# Patient Record
Sex: Male | Born: 1935 | Race: White | Hispanic: No | Marital: Married | Smoking: Former smoker
Health system: Southern US, Community
[De-identification: ages and names within clinical notes are randomized; demographics above are authoritative.]

## PROBLEM LIST (undated history)

## (undated) DIAGNOSIS — J45909 Unspecified asthma, uncomplicated: Secondary | ICD-10-CM

## (undated) DIAGNOSIS — I00 Rheumatic fever without heart involvement: Secondary | ICD-10-CM

## (undated) DIAGNOSIS — I471 Supraventricular tachycardia, unspecified: Secondary | ICD-10-CM

## (undated) DIAGNOSIS — B351 Tinea unguium: Secondary | ICD-10-CM

## (undated) DIAGNOSIS — J449 Chronic obstructive pulmonary disease, unspecified: Secondary | ICD-10-CM

## (undated) DIAGNOSIS — I2510 Atherosclerotic heart disease of native coronary artery without angina pectoris: Secondary | ICD-10-CM

## (undated) DIAGNOSIS — L570 Actinic keratosis: Secondary | ICD-10-CM

## (undated) DIAGNOSIS — K5190 Ulcerative colitis, unspecified, without complications: Secondary | ICD-10-CM

## (undated) DIAGNOSIS — I10 Essential (primary) hypertension: Secondary | ICD-10-CM

## (undated) DIAGNOSIS — R739 Hyperglycemia, unspecified: Secondary | ICD-10-CM

## (undated) DIAGNOSIS — I420 Dilated cardiomyopathy: Secondary | ICD-10-CM

## (undated) DIAGNOSIS — L219 Seborrheic dermatitis, unspecified: Secondary | ICD-10-CM

## (undated) DIAGNOSIS — I872 Venous insufficiency (chronic) (peripheral): Secondary | ICD-10-CM

## (undated) DIAGNOSIS — B353 Tinea pedis: Secondary | ICD-10-CM

## (undated) DIAGNOSIS — E785 Hyperlipidemia, unspecified: Secondary | ICD-10-CM

## (undated) DIAGNOSIS — M1990 Unspecified osteoarthritis, unspecified site: Secondary | ICD-10-CM

## (undated) HISTORY — PX: ILEOSTOMY: SHX1783

## (undated) HISTORY — PX: FUNCTIONAL ENDOSCOPIC SINUS SURGERY: SUR616

## (undated) HISTORY — PX: TONSILLECTOMY: SUR1361

## (undated) HISTORY — DX: Unspecified asthma, uncomplicated: J45.909

## (undated) HISTORY — DX: Tinea pedis: B35.3

## (undated) HISTORY — DX: Actinic keratosis: L57.0

## (undated) HISTORY — DX: Chronic obstructive pulmonary disease, unspecified: J44.9

## (undated) HISTORY — DX: Ulcerative colitis, unspecified, without complications: K51.90

## (undated) HISTORY — DX: Hyperglycemia, unspecified: R73.9

## (undated) HISTORY — DX: Supraventricular tachycardia, unspecified: I47.10

## (undated) HISTORY — DX: Tinea unguium: B35.1

## (undated) HISTORY — DX: Atherosclerotic heart disease of native coronary artery without angina pectoris: I25.10

## (undated) HISTORY — DX: Unspecified osteoarthritis, unspecified site: M19.90

## (undated) HISTORY — PX: CATARACT EXTRACTION: SUR2

## (undated) HISTORY — PX: PROSTATE SURGERY: SHX751

## (undated) HISTORY — DX: Venous insufficiency (chronic) (peripheral): I87.2

## (undated) HISTORY — PX: OTHER SURGICAL HISTORY: SHX169

## (undated) HISTORY — PX: APPENDECTOMY: SHX54

## (undated) HISTORY — DX: Essential (primary) hypertension: I10

## (undated) HISTORY — DX: Dilated cardiomyopathy: I42.0

## (undated) HISTORY — DX: Hyperlipidemia, unspecified: E78.5

## (undated) HISTORY — DX: Supraventricular tachycardia: I47.1

## (undated) HISTORY — DX: Seborrheic dermatitis, unspecified: L21.9

## (undated) HISTORY — PX: BLADDER STONE REMOVAL: SHX568

---

## 1978-08-17 DIAGNOSIS — Z9289 Personal history of other medical treatment: Secondary | ICD-10-CM

## 1978-08-17 HISTORY — DX: Personal history of other medical treatment: Z92.89

## 2016-03-09 HISTORY — PX: OTHER SURGICAL HISTORY: SHX169

## 2018-10-11 DIAGNOSIS — C4492 Squamous cell carcinoma of skin, unspecified: Secondary | ICD-10-CM

## 2018-10-11 HISTORY — DX: Squamous cell carcinoma of skin, unspecified: C44.92

## 2018-11-04 HISTORY — PX: OTHER SURGICAL HISTORY: SHX169

## 2019-11-15 ENCOUNTER — Ambulatory Visit: Payer: Medicare HMO | Admitting: Dermatology

## 2019-11-15 ENCOUNTER — Other Ambulatory Visit: Payer: Self-pay

## 2019-11-15 DIAGNOSIS — L821 Other seborrheic keratosis: Secondary | ICD-10-CM

## 2019-11-15 DIAGNOSIS — L239 Allergic contact dermatitis, unspecified cause: Secondary | ICD-10-CM

## 2019-11-15 DIAGNOSIS — Z85828 Personal history of other malignant neoplasm of skin: Secondary | ICD-10-CM

## 2019-11-15 DIAGNOSIS — B353 Tinea pedis: Secondary | ICD-10-CM

## 2019-11-15 DIAGNOSIS — B351 Tinea unguium: Secondary | ICD-10-CM | POA: Diagnosis not present

## 2019-11-15 MED ORDER — FLUCONAZOLE 200 MG PO TABS: 200.0000 mg | ORAL_TABLET | ORAL | 1 refills | Status: DC

## 2019-11-15 NOTE — Progress Notes (Signed)
   Follow-Up Visit   Subjective  Jared Castro is a 86 y.o. male who presents for the following: Follow-up (Severe tinea pedis and unguium follow up - Diflucan 150mg  1 po 3 times per week.) and Other (Contact dermatitis secondary to ketoconazole follow up. Seems resolved.).  The patient is tolerating his medication well.  He has no history of liver problems and is not on any lipid medications.  He is concerned about some spots on his scalp he would like checked.  They are a bit irritating.  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  The the graft is discolored and he wonders about whether there is anything that can be done to blend this in better.  The following portions of the chart were reviewed this encounter and updated as appropriate:     Review of Systems: No other skin or systemic complaints.  Objective  Well appearing patient in no apparent distress; mood and affect are within normal limits.  A focused examination was performed including feet, scalp. Relevant physical exam findings are noted in the Assessment and Plan.  Objective  Scalp: Well healed excision site.  Objective  Scalp: Stuck-on, waxy, tan-brown papule or plaque --Discussed benign etiology and prognosis.   Objective  bilateral feet: Decreased scale. He is not on any cholesterol medication, does not have a history of liver issues and most recent labs showed normal LFTs.  Objective  Toenails: Severe toenail dystrophy.  Assessment & Plan  History of SCC (squamous cell carcinoma) of skin Scalp With with graft dyschromia.  Discussed using tinted sunscreen to blend the color in better while at the same time protecting against further sun damage and decreasing risk of recurrent or additional skin cancer of the scalp. Observe Recommend tinted sunscreen.    Seborrheic keratosis Scalp  May plan LN2 on follow up if symptomatic and  irritating.  Tinea pedis of right foot bilateral feet He is not on any cholesterol medication, does not have a history of liver issues and most recent labs showed normal LFTs. Reordered Medications fluconazole (DIFLUCAN) 200 MG tablet 1 pill 3 times a week x 2 mos  Tinea unguium Toenails  Reordered Medications fluconazole (DIFLUCAN) 200 MG tablet  History of allergic contact dermatitis to topical ketoconazole for tinea pedis.  The feet are clear today.   Return in about 4 months (around 03/16/2020) for Tinea.   I, Ashok Cordia, CMA, am acting as scribe for Sarina Ser, MD .

## 2019-12-15 ENCOUNTER — Other Ambulatory Visit: Payer: Self-pay | Admitting: Dermatology

## 2020-01-18 ENCOUNTER — Other Ambulatory Visit: Payer: Self-pay | Admitting: Dermatology

## 2020-01-18 DIAGNOSIS — B353 Tinea pedis: Secondary | ICD-10-CM

## 2020-01-18 DIAGNOSIS — B351 Tinea unguium: Secondary | ICD-10-CM

## 2020-03-09 ENCOUNTER — Other Ambulatory Visit: Payer: Self-pay | Admitting: Dermatology

## 2020-03-14 ENCOUNTER — Ambulatory Visit: Payer: Medicare HMO | Admitting: Dermatology

## 2020-04-04 ENCOUNTER — Other Ambulatory Visit: Payer: Self-pay | Admitting: Internal Medicine

## 2020-04-04 ENCOUNTER — Other Ambulatory Visit: Payer: Self-pay

## 2020-04-04 ENCOUNTER — Ambulatory Visit
Admission: RE | Admit: 2020-04-04 | Discharge: 2020-04-04 | Disposition: A | Discharge status: Home / Self Care | Payer: Medicare HMO | Source: Ambulatory Visit | Attending: Internal Medicine | Admitting: Internal Medicine

## 2020-04-04 DIAGNOSIS — R600 Localized edema: Secondary | ICD-10-CM | POA: Diagnosis present

## 2021-07-31 ENCOUNTER — Other Ambulatory Visit: Payer: Self-pay

## 2021-07-31 ENCOUNTER — Emergency Department: Payer: Medicare HMO

## 2021-07-31 ENCOUNTER — Inpatient Hospital Stay
Admission: EM | Admit: 2021-07-31 | Discharge: 2021-08-06 | DRG: 522 | Disposition: A | Discharge status: Home / Self Care | Payer: Medicare HMO | Attending: Family Medicine | Admitting: Family Medicine

## 2021-07-31 DIAGNOSIS — M25551 Pain in right hip: Secondary | ICD-10-CM

## 2021-07-31 DIAGNOSIS — M1909 Primary osteoarthritis, other specified site: Secondary | ICD-10-CM | POA: Diagnosis present

## 2021-07-31 DIAGNOSIS — Z20822 Contact with and (suspected) exposure to covid-19: Secondary | ICD-10-CM | POA: Diagnosis present

## 2021-07-31 DIAGNOSIS — S72011A Unspecified intracapsular fracture of right femur, initial encounter for closed fracture: Principal | ICD-10-CM | POA: Diagnosis present

## 2021-07-31 DIAGNOSIS — G8929 Other chronic pain: Secondary | ICD-10-CM

## 2021-07-31 DIAGNOSIS — I959 Hypotension, unspecified: Secondary | ICD-10-CM | POA: Diagnosis present

## 2021-07-31 DIAGNOSIS — Z95810 Presence of automatic (implantable) cardiac defibrillator: Secondary | ICD-10-CM

## 2021-07-31 DIAGNOSIS — Z79899 Other long term (current) drug therapy: Secondary | ICD-10-CM

## 2021-07-31 DIAGNOSIS — S7291XA Unspecified fracture of right femur, initial encounter for closed fracture: Secondary | ICD-10-CM | POA: Diagnosis present

## 2021-07-31 DIAGNOSIS — I472 Ventricular tachycardia, unspecified: Secondary | ICD-10-CM | POA: Diagnosis present

## 2021-07-31 DIAGNOSIS — E782 Mixed hyperlipidemia: Secondary | ICD-10-CM | POA: Diagnosis not present

## 2021-07-31 DIAGNOSIS — M545 Low back pain, unspecified: Secondary | ICD-10-CM

## 2021-07-31 DIAGNOSIS — D62 Acute posthemorrhagic anemia: Secondary | ICD-10-CM | POA: Diagnosis not present

## 2021-07-31 DIAGNOSIS — S72001A Fracture of unspecified part of neck of right femur, initial encounter for closed fracture: Secondary | ICD-10-CM | POA: Diagnosis not present

## 2021-07-31 DIAGNOSIS — Z8249 Family history of ischemic heart disease and other diseases of the circulatory system: Secondary | ICD-10-CM | POA: Diagnosis not present

## 2021-07-31 DIAGNOSIS — R410 Disorientation, unspecified: Secondary | ICD-10-CM | POA: Diagnosis not present

## 2021-07-31 DIAGNOSIS — I2510 Atherosclerotic heart disease of native coronary artery without angina pectoris: Secondary | ICD-10-CM | POA: Diagnosis present

## 2021-07-31 DIAGNOSIS — I483 Typical atrial flutter: Secondary | ICD-10-CM | POA: Diagnosis present

## 2021-07-31 DIAGNOSIS — E785 Hyperlipidemia, unspecified: Secondary | ICD-10-CM | POA: Diagnosis present

## 2021-07-31 DIAGNOSIS — I499 Cardiac arrhythmia, unspecified: Secondary | ICD-10-CM | POA: Insufficient documentation

## 2021-07-31 DIAGNOSIS — W010XXA Fall on same level from slipping, tripping and stumbling without subsequent striking against object, initial encounter: Secondary | ICD-10-CM | POA: Diagnosis present

## 2021-07-31 DIAGNOSIS — W19XXXA Unspecified fall, initial encounter: Secondary | ICD-10-CM

## 2021-07-31 DIAGNOSIS — Z932 Ileostomy status: Secondary | ICD-10-CM

## 2021-07-31 DIAGNOSIS — Z7982 Long term (current) use of aspirin: Secondary | ICD-10-CM

## 2021-07-31 DIAGNOSIS — I10 Essential (primary) hypertension: Secondary | ICD-10-CM

## 2021-07-31 DIAGNOSIS — Z419 Encounter for procedure for purposes other than remedying health state, unspecified: Secondary | ICD-10-CM

## 2021-07-31 DIAGNOSIS — Z87891 Personal history of nicotine dependence: Secondary | ICD-10-CM

## 2021-07-31 DIAGNOSIS — I4892 Unspecified atrial flutter: Secondary | ICD-10-CM

## 2021-07-31 DIAGNOSIS — G8918 Other acute postprocedural pain: Secondary | ICD-10-CM

## 2021-07-31 DIAGNOSIS — Z85828 Personal history of other malignant neoplasm of skin: Secondary | ICD-10-CM | POA: Diagnosis not present

## 2021-07-31 DIAGNOSIS — Z01811 Encounter for preprocedural respiratory examination: Secondary | ICD-10-CM

## 2021-07-31 DIAGNOSIS — R443 Hallucinations, unspecified: Secondary | ICD-10-CM

## 2021-07-31 DIAGNOSIS — I420 Dilated cardiomyopathy: Secondary | ICD-10-CM | POA: Diagnosis present

## 2021-07-31 LAB — CBC WITH DIFFERENTIAL/PLATELET
Abs Immature Granulocytes: 0.07 10*3/uL (ref 0.00–0.07)
Basophils Absolute: 0 10*3/uL (ref 0.0–0.1)
Basophils Relative: 0 %
Eosinophils Absolute: 0.1 10*3/uL (ref 0.0–0.5)
Eosinophils Relative: 1 %
HCT: 37.3 % — ABNORMAL LOW (ref 39.0–52.0)
Hemoglobin: 12.3 g/dL — ABNORMAL LOW (ref 13.0–17.0)
Immature Granulocytes: 1 %
Lymphocytes Relative: 7 %
Lymphs Abs: 0.8 10*3/uL (ref 0.7–4.0)
MCH: 30.4 pg (ref 26.0–34.0)
MCHC: 33 g/dL (ref 30.0–36.0)
MCV: 92.3 fL (ref 80.0–100.0)
Monocytes Absolute: 0.7 10*3/uL (ref 0.1–1.0)
Monocytes Relative: 6 %
Neutro Abs: 8.6 10*3/uL — ABNORMAL HIGH (ref 1.7–7.7)
Neutrophils Relative %: 85 %
Platelets: 205 10*3/uL (ref 150–400)
RBC: 4.04 MIL/uL — ABNORMAL LOW (ref 4.22–5.81)
RDW: 13.8 % (ref 11.5–15.5)
WBC: 10.2 10*3/uL (ref 4.0–10.5)
nRBC: 0 % (ref 0.0–0.2)

## 2021-07-31 LAB — BASIC METABOLIC PANEL
Anion gap: 10 (ref 5–15)
BUN: 22 mg/dL (ref 8–23)
CO2: 22 mmol/L (ref 22–32)
Calcium: 8.9 mg/dL (ref 8.9–10.3)
Chloride: 105 mmol/L (ref 98–111)
Creatinine, Ser: 1.04 mg/dL (ref 0.61–1.24)
GFR, Estimated: >60 mL/min (ref >60)
Glucose, Bld: 109 mg/dL — ABNORMAL HIGH (ref 70–99)
Potassium: 4.1 mmol/L (ref 3.5–5.1)
Sodium: 137 mmol/L (ref 135–145)

## 2021-07-31 LAB — TYPE AND SCREEN
ABO/RH(D): O POS
Antibody Screen: NEGATIVE

## 2021-07-31 LAB — RESP PANEL BY RT-PCR (FLU A&B, COVID) ARPGX2
Influenza A by PCR: NEGATIVE
Influenza B by PCR: NEGATIVE
SARS Coronavirus 2 by RT PCR: NEGATIVE

## 2021-07-31 LAB — APTT: aPTT: 28 seconds (ref 24–36)

## 2021-07-31 LAB — PROTIME-INR
INR: 1.1 (ref 0.8–1.2)
Prothrombin Time: 13.7 seconds (ref 11.4–15.2)

## 2021-07-31 MED ORDER — ACETAMINOPHEN 500 MG PO TABS: 1000.0000 mg | ORAL_TABLET | Freq: Once | ORAL | Status: DC

## 2021-07-31 MED ORDER — LOSARTAN POTASSIUM 25 MG PO TABS
25.0000 mg | ORAL_TABLET | Freq: Every day | ORAL | Status: DC
  Administered 2021-08-01: 25 mg via ORAL
  Filled 2021-07-31: qty 1

## 2021-07-31 MED ORDER — MORPHINE SULFATE (PF) 4 MG/ML IV SOLN
4.0000 mg | Freq: Once | INTRAVENOUS | Status: AC
  Administered 2021-07-31: 4 mg via INTRAVENOUS
  Filled 2021-07-31: qty 1

## 2021-07-31 MED ORDER — HYDROCODONE-ACETAMINOPHEN 5-325 MG PO TABS
1.0000 | ORAL_TABLET | Freq: Four times a day (QID) | ORAL | Status: DC | PRN
  Administered 2021-07-31: 1 via ORAL
  Administered 2021-08-01: 2 via ORAL
  Filled 2021-07-31: qty 1
  Filled 2021-07-31: qty 2

## 2021-07-31 MED ORDER — MORPHINE SULFATE (PF) 2 MG/ML IV SOLN
0.5000 mg | INTRAVENOUS | Status: DC | PRN
  Administered 2021-07-31 – 2021-08-01 (×3): 0.5 mg via INTRAVENOUS
  Filled 2021-07-31 (×3): qty 1

## 2021-07-31 MED ORDER — MAGNESIUM OXIDE -MG SUPPLEMENT 400 (240 MG) MG PO TABS
400.0000 mg | ORAL_TABLET | Freq: Two times a day (BID) | ORAL | Status: DC
  Administered 2021-07-31 – 2021-08-06 (×12): 400 mg via ORAL
  Filled 2021-07-31 (×12): qty 1

## 2021-07-31 MED ORDER — ACETAMINOPHEN 650 MG RE SUPP: 650.0000 mg | Freq: Four times a day (QID) | RECTAL | Status: DC | PRN

## 2021-07-31 MED ORDER — ONDANSETRON HCL 4 MG/2ML IJ SOLN: 4.0000 mg | Freq: Four times a day (QID) | INTRAMUSCULAR | Status: DC | PRN

## 2021-07-31 MED ORDER — ACETAMINOPHEN 325 MG PO TABS
650.0000 mg | ORAL_TABLET | Freq: Four times a day (QID) | ORAL | Status: DC | PRN
  Administered 2021-08-02: 650 mg via ORAL
  Filled 2021-07-31: qty 2

## 2021-07-31 MED ORDER — ADULT MULTIVITAMIN W/MINERALS CH
1.0000 | ORAL_TABLET | Freq: Every day | ORAL | Status: DC
  Administered 2021-08-01 – 2021-08-06 (×6): 1 via ORAL
  Filled 2021-07-31 (×6): qty 1

## 2021-07-31 MED ORDER — VITAMIN D 25 MCG (1000 UNIT) PO TABS
1000.0000 [IU] | ORAL_TABLET | Freq: Every day | ORAL | Status: DC
  Administered 2021-08-01 – 2021-08-06 (×6): 1000 [IU] via ORAL
  Filled 2021-07-31 (×6): qty 1

## 2021-07-31 MED ORDER — MOMETASONE FURO-FORMOTEROL FUM 200-5 MCG/ACT IN AERO
2.0000 | INHALATION_SPRAY | Freq: Two times a day (BID) | RESPIRATORY_TRACT | Status: DC
  Administered 2021-08-01: 2 via RESPIRATORY_TRACT
  Filled 2021-07-31: qty 8.8

## 2021-07-31 MED ORDER — ALBUTEROL SULFATE (2.5 MG/3ML) 0.083% IN NEBU: 3.0000 mL | INHALATION_SOLUTION | Freq: Four times a day (QID) | RESPIRATORY_TRACT | Status: DC | PRN

## 2021-07-31 MED ORDER — POLYETHYLENE GLYCOL 3350 17 G PO PACK
17.0000 g | PACK | Freq: Two times a day (BID) | ORAL | Status: AC | PRN
  Administered 2021-08-02: 17 g via ORAL
  Filled 2021-07-31: qty 1

## 2021-07-31 MED ORDER — HEPARIN SODIUM (PORCINE) 5000 UNIT/ML IJ SOLN
5000.0000 [IU] | Freq: Three times a day (TID) | INTRAMUSCULAR | Status: DC
  Administered 2021-07-31 – 2021-08-01 (×2): 5000 [IU] via SUBCUTANEOUS
  Filled 2021-07-31 (×2): qty 1

## 2021-07-31 MED ORDER — SODIUM CHLORIDE 0.9 % IV SOLN
6.2500 mg | Freq: Four times a day (QID) | INTRAVENOUS | Status: AC | PRN
  Filled 2021-07-31: qty 0.25

## 2021-07-31 MED ORDER — METOPROLOL SUCCINATE ER 50 MG PO TB24: 25.0000 mg | ORAL_TABLET | Freq: Every day | ORAL | Status: DC

## 2021-07-31 MED ORDER — MORPHINE SULFATE (PF) 2 MG/ML IV SOLN
2.0000 mg | Freq: Once | INTRAVENOUS | Status: AC
  Administered 2021-07-31: 2 mg via INTRAVENOUS
  Filled 2021-07-31: qty 1

## 2021-07-31 MED ORDER — METOPROLOL SUCCINATE ER 25 MG PO TB24
25.0000 mg | ORAL_TABLET | Freq: Two times a day (BID) | ORAL | Status: DC
  Administered 2021-07-31 – 2021-08-02 (×3): 25 mg via ORAL
  Filled 2021-07-31 (×3): qty 1

## 2021-07-31 NOTE — ED Provider Notes (Signed)
[facility] Emergency Department Provider Note ____________________________________________   Event Date/Time   First MD Initiated Contact with Patient 07/31/21 1709     (approximate)  I have reviewed the triage vital signs and the nursing notes.  HISTORY  Chief Complaint Fall   HPI Jared Castro is a 87 y.o. malewho presents to the ED for evaluation of right hip pain after a fall.   Chart review indicates ICD/CRT in place due to history of cardiomyopathy.  No anticoagulation.  Patient presents to the ED, accompanied by his wife, for evaluation of right hip pain after mechanical fall that occurred earlier this afternoon.  He reports picking up some laundry from the floor when he lost his balance, falling and striking his right hip on the floor.  Denies head trauma or syncope.  Denies additional trauma.  Reports moderately severe intensity pain to his right hip, radiating up towards his right groin.  Has not taken any medications yet.   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   History reviewed. No pertinent surgical history.  Prior to Admission medications   Medication Sig Start Date End Date Taking? Authorizing Provider  albuterol (VENTOLIN HFA) 108 (90 Base) MCG/ACT inhaler Inhale into the lungs. 06/27/14   [provider]  aspirin 81 MG EC tablet Take by mouth.    [provider]  calcipotriene (DOVONOX) 0.005 % ointment  09/25/15   [provider]  desonide (DESOWEN) 0.05 % cream APPLY EXTERNALLY TO THE AFFECTED AREA DAILY TO TWICE DAILY AS DIRECTED AS NEEDED FOR RASH 12/19/19   Ralene Bathe, MD  digoxin (LANOXIN) 0.25 MG tablet Take by mouth. 11/09/12   [provider]  fluconazole  (DIFLUCAN) 200 MG tablet TAKE 1 TABLET(200 MG) BY MOUTH 3 TIMES A WEEK 01/18/20   Ralene Bathe, MD  fluocinonide ointment (LIDEX) 0.05 % Apply topically. 06/24/16   [provider]  Fluorouracil 5 % SOLN Apply to scalp twice daily with calcipotriene solution for 4 days 09/25/15   [provider]  fluticasone (FLONASE) 50 MCG/ACT nasal spray INSTILL 2 SPRAYS INTO EACH NOSTRIL ONCE DAILY 10/09/16   [provider]  fluticasone-salmeterol (ADVAIR HFA) 115-21 MCG/ACT inhaler Inhale into the lungs. 07/25/14   [provider]  ketoconazole (NIZORAL) 2 % cream Apply 1 application topically 2 (two) times daily.    [provider]  lisinopril (ZESTRIL) 5 MG tablet Take by mouth. 12/10/15   [provider]  losartan (COZAAR) 25 MG tablet Take 25 mg by mouth daily.    [provider]  Metoprolol Succinate 25 MG CS24 Take by mouth.    [provider]  minocycline (MINOCIN) 100 MG capsule Take by mouth. 09/25/15   [provider]  Misc. Devices MISC Ostomy supplies (post partial colectomy): Convatec Pouch [id number], Convatec  Durahesive  [phone], Convatec Eakin Seals, and  Microporre tape; ICD 10 is Z90.49 11/25/17   [provider]  Multiple Vitamin (MULTIVITAMIN) tablet Take by mouth.    [provider]  pimecrolimus (ELIDEL) 1 % cream APPLY EXTERNALLY TO THE AFFECTED AREA TWICE DAILY 03/11/20   Ralene Bathe, MD  Probiotic Product (EQ PROBIOTIC) CAPS Take by mouth.  [provider]  tretinoin (RETIN-A) 0.025 % cream Apply topically. 02/12/16   [provider]  triamcinolone cream (KENALOG) 0.1 % as needed 05/17/17   [provider]    Allergies Patient has no known allergies.  No family history on file.  Social History Social History   Tobacco Use   Smoking status: Never   Smokeless tobacco: Never    Review of Systems  Constitutional: No fever/chills Eyes: No visual  changes. ENT: No sore throat. Cardiovascular: Denies chest pain. Respiratory: Denies shortness of breath. Gastrointestinal: No abdominal pain.  No nausea, no vomiting.  No diarrhea.  No constipation. Genitourinary: Negative for dysuria. Musculoskeletal: Negative for back pain. Right hip pain after a fall. Skin: Negative for rash. Neurological: Negative for headaches, focal weakness or numbness.  ____________________________________________   PHYSICAL EXAM:  VITAL SIGNS: Vitals:   07/31/21 1715 07/31/21 1830  BP: (!) 151/69 113/62  Pulse: 72 81  Resp: 16 14  Temp: (!) 97.5 F (36.4 C)   SpO2: 94% 94%     Constitutional: Alert and oriented. Well appearing and in no acute distress. Eyes: Conjunctivae are normal. PERRL. EOMI. Head: Atraumatic. Nose: No congestion/rhinnorhea. Mouth/Throat: Mucous membranes are moist.  Oropharynx non-erythematous. Neck: No stridor. No cervical spine tenderness to palpation. Cardiovascular: Normal rate, regular rhythm. Grossly normal heart sounds.  Good peripheral circulation. Respiratory: Normal respiratory effort.  No retractions. Lungs CTAB. Gastrointestinal: Soft , nondistended, nontender to palpation. No CVA tenderness. Musculoskeletal: Right leg is shortened compared to the left.  It is distally neurovascularly intact.  Pain with logrolling. Palpation of all 4 extremities otherwise no evidence of deformity, tenderness or trauma. Neurologic:  Normal speech and language. No gross focal neurologic deficits are appreciated. No gait instability noted. Skin:  Skin is warm, dry and intact. No rash noted. Psychiatric: Mood and affect are normal. Speech and behavior are normal.  ____________________________________________   LABS (all labs ordered are listed, but only abnormal results are displayed)  Labs Reviewed  CBC WITH DIFFERENTIAL/PLATELET - Abnormal; Notable for the following components:      Result Value   RBC 4.04 (*)    Hemoglobin  12.3 (*)    HCT 37.3 (*)    Neutro Abs 8.6 (*)    All other components within normal limits  RESP PANEL BY RT-PCR (FLU A&B, COVID) [id number]  BASIC METABOLIC PANEL  PROTIME-INR  APTT  TYPE AND SCREEN   ____________________________________________  12 Lead EKG  Sinus rhythm, rate of 88 bpm.  Normal axis.  Left bundle and no evidence of acute ischemia per Sgarbossa criteria. ____________________________________________  RADIOLOGY  ED MD interpretation: Plain film of the pelvis and right hip reviewed by me with right femoral neck fracture. CXR reviewed by me without evidence of acute cardiopulmonary pathology.  Official radiology report(s): DG Chest 1 View  Result Date: 07/31/2021 CLINICAL DATA:  Hip fracture EXAM: CHEST  1 VIEW COMPARISON:  None. FINDINGS: Left-sided pacing device. No focal opacity, pleural effusion or pneumothorax. Normal cardiac size. IMPRESSION: No active disease. Electronically Signed   By: Donavan Foil M.D.   On: 07/31/2021 18:05   DG Hip Unilat  With Pelvis 2-3 Views Right  Result Date: 07/31/2021 CLINICAL DATA:  Fall EXAM: DG HIP (WITH OR WITHOUT PELVIS) 2-3V RIGHT COMPARISON:  None. FINDINGS: Postsurgical changes over the pelvis. Multiple phleboliths. Pubic symphysis and rami appear intact. Acute right subcapital femoral neck fracture with cranial displacement of the trochanter. No femoral head dislocation. Probable right lower quadrant ostomy. IMPRESSION:  Acute displaced right femoral neck fracture. Electronically Signed   By: Donavan Foil M.D.   On: 07/31/2021 18:05    ____________________________________________   PROCEDURES and INTERVENTIONS  Procedure(s) performed (including Critical Care):  Procedures  Medications  acetaminophen (TYLENOL) tablet 1,000 mg (1,000 mg Oral Not Given 07/31/21 1827)  morphine 2 MG/ML injection 2 mg (has no administration in time range)  morphine 4 MG/ML injection 4 mg (4 mg Intravenous Given 07/31/21 1815)     ____________________________________________   MDM / ED COURSE   Pleasant and quite functional 87 year old male presents to the ED with right hip pain after mechanical fall, with evidence of a right femoral neck fracture requiring medical admission to facilitate orthopedic fixation.  He appears clinically well.  No signs of neurologic or vascular deficits.  No signs of trauma beyond his right hip.  X-ray confirms displaced right femoral neck fracture.  EKG without evidence of cardiac syncope.  We will send screening labs and admit to medicine  Clinical Course as of 07/31/21 1849  Thu Jul 31, 2021  1849 I discussed with Dr. Rudene Christians, who reports that they will likely get the patient on Saturday.  Unfortunately limited due to OR staffing and cannot get him in tomorrow. I have to the patient of this [DS]    Clinical Course User Index [DS] Vladimir Crofts, MD    ____________________________________________   FINAL CLINICAL IMPRESSION(S) / ED DIAGNOSES  Final diagnoses:  Closed displaced fracture of right femoral neck ([place])  Fall, initial encounter  Acute right hip pain     ED Discharge Orders     None        Otillia Cordone   Note:  This document was prepared using Dragon voice recognition software and may include unintentional dictation errors.    Vladimir Crofts, MD 07/31/21 [phone]

## 2021-07-31 NOTE — Consult Note (Signed)
Reason for Consult: Right femoral neck fracture Referring Physician: Dr. Francene Finders Burston is an 87 y.o. male.  HPI: Patient fell earlier today while getting laundry.  He has a history of significant osteoarthritis and no prodromal symptoms.  He normally is quite active him and his wife walked about 2 miles a day at [facility].  He does have significant cardiomyopathy.  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 No pertinent surgical history.  No family history on file.  Social History:  reports that he has never smoked. He has never used smokeless tobacco. No history on file for alcohol use and drug use.  Allergies: No Known Allergies  Medications: I have reviewed the patient's current medications.  Results for orders placed or performed during the hospital encounter of 07/31/21 (from the past 48 hour(s))  Basic metabolic panel     Status: Abnormal   Collection Time: 07/31/21  6:16 PM  Result Value Ref Range   Sodium 137 135 - 145 mmol/L   Potassium 4.1 3.5 - 5.1 mmol/L   Chloride 105 98 - 111 mmol/L   CO2 22 22 - 32 mmol/L   Glucose, Bld 109 (H) 70 - 99 mg/dL    Comment: Glucose reference range applies only to samples taken after fasting for at least 8 hours.   BUN 22 8 - 23 mg/dL   Creatinine, Ser 1.04 0.61 - 1.24 mg/dL   Calcium 8.9 8.9 - 10.3 mg/dL   GFR, Estimated >60 >60 mL/min    Comment: (NOTE) Calculated using the CKD-EPI Creatinine Equation (2021)    Anion gap 10 5 - 15    Comment: Performed at [facility], [address] [place], [place] [address]  CBC with Differential/Platelet     Status: Abnormal   Collection Time: 07/31/21  6:16 PM  Result Value Ref Range   WBC 10.2 4.0 - 10.5 K/uL   RBC 4.04 (L) 4.22 - 5.81 MIL/uL   Hemoglobin 12.3 (L) 13.0 -  17.0 g/dL   HCT 37.3 (L) 39.0 - 52.0 %   MCV 92.3 80.0 - 100.0 fL   MCH 30.4 26.0 - 34.0 pg   MCHC 33.0 30.0 - 36.0 g/dL   RDW 13.8 11.5 - 15.5 %   Platelets 205 150 - 400 K/uL   nRBC 0.0 0.0 - 0.2 %   Neutrophils Relative % 85 %   Neutro Abs 8.6 (H) 1.7 - 7.7 K/uL   Lymphocytes Relative 7 %   Lymphs Abs 0.8 0.7 - 4.0 K/uL   Monocytes Relative 6 %   Monocytes Absolute 0.7 0.1 - 1.0 K/uL   Eosinophils Relative 1 %   Eosinophils Absolute 0.1 0.0 - 0.5 K/uL   Basophils Relative 0 %   Basophils Absolute 0.0 0.0 - 0.1 K/uL   Immature Granulocytes 1 %   Abs Immature Granulocytes 0.07 0.00 - 0.07 K/uL    Comment: Performed at [facility], [address] [place], [place] [address]  Protime-INR     Status: None   Collection Time: 07/31/21  6:16 PM  Result Value Ref Range   Prothrombin Time 13.7 11.4 - 15.2 seconds   INR 1.1 0.8 - 1.2    Comment: (NOTE) INR goal varies based on  device and disease states. Performed at [facility], [address] [place], [place] [address]   APTT     Status: None   Collection Time: 07/31/21  6:16 PM  Result Value Ref Range   aPTT 28 24 - 36 seconds    Comment: Performed at [facility], [address] [place], [place] [address]  Resp Panel by RT-PCR (Flu A&B, Covid) Nasopharyngeal Swab     Status: None   Collection Time: 07/31/21  6:17 PM   Specimen: Nasopharyngeal Swab; Nasopharyngeal(NP) swabs in vial transport medium  Result Value Ref Range   SARS Coronavirus 2 by RT PCR NEGATIVE NEGATIVE    Comment: (NOTE) SARS-CoV-2 target nucleic acids are NOT DETECTED.  The SARS-CoV-2 RNA is generally detectable in upper respiratory specimens during the acute phase of infection. The lowest concentration of SARS-CoV-2 viral copies this assay can detect is 138 copies/mL. A negative result does not preclude SARS-Cov-2 infection and should not be used as the sole basis for treatment or other patient management decisions. A  negative result may occur with  improper specimen collection/handling, submission of specimen other than nasopharyngeal swab, presence of viral mutation(s) within the areas targeted by this assay, and inadequate number of viral copies(<138 copies/mL). A negative result must be combined with clinical observations, patient history, and epidemiological information. The expected result is Negative.  Fact Sheet for Patients:  EntrepreneurPulse.com.au  Fact Sheet for Healthcare Providers:  IncredibleEmployment.be  This test is no t yet approved or cleared by the [place] FDA and  has been authorized for detection and/or diagnosis of SARS-CoV-2 by FDA under an Emergency Use Authorization (EUA). This EUA will remain  in effect (meaning this test can be used) for the duration of the COVID-19 declaration under Section 564(b)(1) of the Act, 21 U.S.C.section 360bbb-3(b)(1), unless the authorization is terminated  or revoked sooner.       Influenza A by PCR NEGATIVE NEGATIVE   Influenza B by PCR NEGATIVE NEGATIVE    Comment: (NOTE) The Xpert Xpress SARS-CoV-2/FLU/RSV plus assay is intended as an aid in the diagnosis of influenza from Nasopharyngeal swab specimens and should not be used as a sole basis for treatment. Nasal washings and aspirates are unacceptable for Xpert Xpress SARS-CoV-2/FLU/RSV testing.  Fact Sheet for Patients: EntrepreneurPulse.com.au  Fact Sheet for Healthcare Providers: IncredibleEmployment.be  This test is not yet approved or cleared by the [place] FDA and has been authorized for detection and/or diagnosis of SARS-CoV-2 by FDA under an Emergency Use Authorization (EUA). This EUA will remain in effect (meaning this test can be used) for the duration of the COVID-19 declaration under Section 564(b)(1) of the Act, 21 U.S.C. section 360bbb-3(b)(1), unless the authorization is terminated  or revoked.  Performed at [facility], [address] [place], [place] [address]   Type and screen     Status: None (Preliminary result)   Collection Time: 07/31/21  7:28 PM  Result Value Ref Range   ABO/RH(D) PENDING    Antibody Screen PENDING    Sample Expiration      08/03/2021,2359 Performed at [facility], [address] [place], [place] [address]     DG Chest 1 View  Result Date: 07/31/2021 CLINICAL DATA:  Hip fracture EXAM: CHEST  1 VIEW COMPARISON:  None. FINDINGS: Left-sided pacing device. No focal opacity, pleural effusion or pneumothorax. Normal cardiac size. IMPRESSION: No active disease. Electronically Signed   By: Donavan Foil M.D.   On: 07/31/2021 18:05  DG Hip Unilat  With Pelvis 2-3 Views Right  Result Date: 07/31/2021 CLINICAL DATA:  Fall EXAM: DG HIP (WITH OR WITHOUT PELVIS) 2-3V RIGHT COMPARISON:  None. FINDINGS: Postsurgical changes over the pelvis. Multiple phleboliths. Pubic symphysis and rami appear intact. Acute right subcapital femoral neck fracture with cranial displacement of the trochanter. No femoral head dislocation. Probable right lower quadrant ostomy. IMPRESSION: Acute displaced right femoral neck fracture. Electronically Signed   By: Donavan Foil M.D.   On: 07/31/2021 18:05    Review of Systems Blood pressure 123/84, pulse 84, temperature (!) 97.5 F (36.4 C), temperature source Oral, resp. rate 16, height 5\' 8"  (1.727 m), SpO2 93 %. Physical Exam Right leg is shortened and externally rotated with intact pulses and flexion extension of the toes and ankle noted.  Skin is intact around the hip. Assessment/Plan: Displaced femoral neck fracture with significant osteoarthritis. Plan is for right total hip hybrid stent with cemented stem plan on Saturday morning.  Anesthesia prey want cardiology consult that we placed with [facility] cardiology where he has been seen in the past.  Hessie Knows 07/31/2021, 8:06 PM

## 2021-07-31 NOTE — ED Triage Notes (Signed)
Patient to ER via [facility] from [facility]. Reports having a mechanical fall this afternoon. Reports miss stepping by his dryer. Complaints of right groin/ hip pain. No worsening pain with palpation or obvious deformity noted. Unable to bear weight or walk on extremity.   Ems VSS- bp 148/78, HR 80's, spo2 98%

## 2021-07-31 NOTE — Progress Notes (Signed)
Xrays and chart reviewed. Plan right THA for DJD and femoral neck fracture, planning surgery on Saturday AM because of limited OR time tomorrow.

## 2021-07-31 NOTE — H&P (Signed)
History and Physical   Jared Castro RXV:[id number] DOB: 11-09-1932 DOA: 07/31/2021  PCP: Adin Hector, MD  Specialist: Dr. Ubaldo Glassing, cardiology Patient coming from: [facility]  I have personally briefly reviewed patient's old medical records in [place].  Chief Concern: Fall with right hip pain  HPI: Jared Castro is a 87 y.o. 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  Patient states that they have a stacking laundry set with the dryer on top.  He states that he was reaching into the dryer to grab the tennis ball and it fell out and he took a turn at the wrong angle and tumbled over.  He denies head trauma, loss of consciousness, chest pain, shortness of breath, recent fever, chills, pain with swallowing, cough, abdominal pain, dysuria, hematuria, diarrhea.  Social history: He lives at home in [place] with his wife.  He is retired and formerly worked for [employer] at [employer] now [employer].  He denies history of tobacco, recreational drug use, EtOH.  Vaccination history: He is fully vaccinated for COVID-19 as his wife is immunocompromise.  ROS: Constitutional: no weight change, no fever ENT/Mouth: no sore throat, no rhinorrhea Eyes: no eye pain, no vision changes Cardiovascular: no chest pain, no dyspnea,  no edema, no palpitations Respiratory: no cough, no sputum, no wheezing Gastrointestinal: no nausea, no vomiting, no diarrhea, no constipation Genitourinary: no urinary incontinence, no dysuria, no hematuria Musculoskeletal: no arthralgias, + myalgias Skin: no skin lesions, no  pruritus, Neuro: + weakness, no loss of consciousness, no syncope Psych: no anxiety, no depression, no decrease appetite Heme/Lymph: no bruising, no bleeding  ED Course: Discussed with emergency medicine provider, patient requiring hospitalization for chief concerns of acute right femoral neck displaced fracture.  Vitals in the emergency department was stable with temperature of 97.5, respiration rate of 16, heart rate of 72, initial blood pressure 151/69, SPO2 of 94% on room air.  WBC was mildly elevated at 10.2, hemoglobin 12.3, platelets 205.   Serum sodium was 137, potassium 4.1, chloride 105, bicarb 22, nonfasting blood glucose 109, BUN of 22, serum creatinine of 1.04, GFR greater than 60.  In the emergency department patient received morphine 4 mg IV, acetaminophen 1000 mg.  Assessment/Plan  Principal Problem:   Right femoral fracture (HCC) Active Problems:   Essential hypertension   ICD (implantable cardioverter-defibrillator) in place   Nonischemic dilated cardiomyopathy (HCC)   HLD (hyperlipidemia)   Chronic midline low back pain   # Acute right femoral fracture-secondary to mechanical fall - Symptomatic support with hydrocodone-acetaminophen 1 to 2 tablets every 6 hours as needed for moderate pain; morphine 0.5 mg IV every 2 hours as needed for severe pain - Orthopedic provider, Dr. Rudene Christians has been consulted and plan for right THA for DJD on Saturday a.m. (08/02/2021) due to limited OR time on 12/16 - A.m. team to order for n.p.o. midnight of 08/01/2021  # Hypertension-losartan 25 mg daily resumed  # Nonischemic dilated cardiomyopathy-resumed home metoprolol succinate 25 mg bid  # DVT prophylaxis-I have ordered heparin 5000 units subcutaneous every 8 hours, for 4 doses in anticipation of orthopedic OR intervention on 08/02/2021 - A.m. team to resume pharmacologic DVT prophylaxis when  appropriate  As needed medication: MiraLAX/Ex-Lax twice daily as needed for mild and  moderate constipation, morphine 0.5 mg IV every 2 hours.  For severe pain, Norco, acetaminophen, Zofran, Phenergan  Chart reviewed.   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 His smoking use included cigarettes. He has never used smokeless tobacco. He reports current alcohol use of about 1.0 standard drink per week. He reports that he does not use drugs.  No Known Allergies Family History  Problem Relation Age of Onset   Hypertension Mother    Cancer Mother    Family history: Family history reviewed and not pertinent  Prior to Admission medications   Medication Sig Start Date End Date Taking? Authorizing Provider  albuterol (VENTOLIN HFA) 108 (90 Base) MCG/ACT inhaler Inhale into the lungs. 06/27/14   [provider]  aspirin 81 MG EC tablet Take by mouth.    [provider]  calcipotriene (DOVONOX) 0.005 % ointment  09/25/15   [provider]  desonide (DESOWEN) 0.05 % cream APPLY EXTERNALLY TO THE AFFECTED AREA DAILY TO TWICE DAILY AS DIRECTED AS NEEDED FOR RASH 12/19/19   Ralene Bathe, MD  digoxin (LANOXIN) 0.25 MG tablet Take by mouth. 11/09/12   [provider]  fluconazole (DIFLUCAN) 200 MG tablet TAKE 1 TABLET(200 MG) BY MOUTH 3 TIMES A WEEK  01/18/20   Ralene Bathe, MD  fluocinonide ointment (LIDEX) 0.05 % Apply topically. 06/24/16   [provider]  Fluorouracil 5 % SOLN Apply to scalp twice daily with calcipotriene solution for 4 days 09/25/15   [provider]  fluticasone (FLONASE) 50 MCG/ACT nasal spray INSTILL 2 SPRAYS INTO EACH NOSTRIL ONCE DAILY 10/09/16   [provider]  fluticasone-salmeterol (ADVAIR HFA) 115-21 MCG/ACT inhaler Inhale into the lungs. 07/25/14   [provider]  ketoconazole (NIZORAL) 2 % cream Apply 1 application topically 2 (two) times daily.    [provider]  lisinopril (ZESTRIL) 5 MG tablet Take by mouth. 12/10/15   [provider]  losartan (COZAAR) 25 MG tablet Take 25 mg by mouth daily.    [provider]  Metoprolol Succinate 25 MG CS24 Take by mouth.    [provider]  minocycline (MINOCIN) 100 MG capsule Take by mouth. 09/25/15   [provider]  Misc. Devices MISC Ostomy supplies (post partial colectomy): Convatec Pouch [id number], Convatec  Durahesive  [phone], Convatec [place]akin Seals, and  Microporre tape; ICD 10 is Z90.49 11/25/17   [provider]  Multiple Vitamin (MULTIVITAMIN) tablet Take by mouth.    [provider]  pimecrolimus (ELIDEL) 1 % cream APPLY EXTERNALLY TO THE AFFECTED AREA TWICE DAILY 03/11/20   Ralene Bathe, MD  Probiotic Product (EQ PROBIOTIC) CAPS Take by mouth.    [provider]  tretinoin (RETIN-A) 0.025 % cream Apply topically. 02/12/16   [provider]  triamcinolone cream (KENALOG) 0.1 % as needed 05/17/17   [provider]   Physical Exam: Vitals:   07/31/21 1714 07/31/21 1715 07/31/21 1830 07/31/21 1900  BP:  (!) 151/69 113/62 123/84  Pulse:  72 81 84  Resp:  16 14 16   Temp:  (!) 97.5 F (36.4 C)    TempSrc:  Oral    SpO2:  94% 94% 93%  Height: 5\' 8"  (1.727 m)      Constitutional: appears age-appropriate, NAD, calm, comfortable Eyes:  PERRL, lids and conjunctivae normal ENMT: Mucous membranes are moist. Posterior pharynx clear of any exudate or lesions. Age-appropriate dentition. Hearing appropriate Neck: normal, supple, no masses, no thyromegaly Respiratory: clear to auscultation bilaterally, no wheezing, no crackles. Normal respiratory effort. No accessory muscle use.  Cardiovascular: Regular rate and rhythm, no murmurs / rubs / gallops. No extremity edema. 2+ pedal pulses. No carotid bruits.  Abdomen: Ileostomy in place, no tenderness, no masses palpated, no hepatosplenomegaly. Bowel sounds positive.  Musculoskeletal: no clubbing / cyanosis. No joint deformity upper and lower extremities. no contractures, no atrophy. Normal muscle tone.  Decreased range of motion of the right hip.  Pain at the right hip Skin: no rashes, lesions, ulcers. No induration Neurologic: Sensation intact. Strength 5/5 in all 4.  Psychiatric: Normal judgment and insight. Alert and oriented x 3. Normal mood.   EKG: independently reviewed, showing sinus rhythm with rate of 88, QTc 483  Chest x-ray on Admission: I personally reviewed and I agree with radiologist reading as below.  DG Chest 1 View  Result Date: 07/31/2021 CLINICAL DATA:  Hip fracture EXAM: CHEST  1 VIEW COMPARISON:  None. FINDINGS: Left-sided pacing device. No focal opacity, pleural effusion or pneumothorax. Normal cardiac size. IMPRESSION: No active disease. Electronically Signed   By: Donavan Foil M.D.   On: 07/31/2021 18:05   DG Hip Unilat  With Pelvis 2-3 Views Right  Result Date: 07/31/2021 CLINICAL DATA:  Fall EXAM: DG HIP (WITH OR WITHOUT PELVIS) 2-3V RIGHT COMPARISON:  None. FINDINGS: Postsurgical changes over the pelvis. Multiple phleboliths. Pubic symphysis and rami appear intact. Acute right subcapital femoral neck fracture with cranial displacement of the trochanter. No femoral head dislocation. Probable right lower quadrant ostomy. IMPRESSION: Acute displaced right  femoral neck fracture. Electronically Signed   By: Donavan Foil M.D.   On: 07/31/2021 18:05    Labs on Admission: I have personally reviewed following labs  CBC: Recent Labs  Lab 07/31/21 1816  WBC 10.2  NEUTROABS 8.6*  HGB 12.3*  HCT 37.3*  MCV 92.3  PLT 390   Basic Metabolic Panel: Recent Labs  Lab 07/31/21 1816  NA 137  K 4.1  CL 105  CO2 22  GLUCOSE 109*  BUN 22  CREATININE 1.04  CALCIUM 8.9   Coagulation Profile: Recent Labs  Lab 07/31/21 1816  INR 1.1   Dr. Tobie Poet [facility]  If 7PM-7AM, please contact overnight-coverage provider If 7AM-7PM, please contact day coverage provider [email or web address]  07/31/2021, 8:27 PM

## 2021-08-01 ENCOUNTER — Inpatient Hospital Stay: Payer: Medicare HMO

## 2021-08-01 ENCOUNTER — Encounter: Payer: Self-pay | Admitting: Internal Medicine

## 2021-08-01 ENCOUNTER — Inpatient Hospital Stay: Payer: Medicare HMO | Admitting: Anesthesiology

## 2021-08-01 ENCOUNTER — Encounter
Admission: EM | Disposition: A | Discharge status: Home / Self Care | Payer: Self-pay | Source: Home / Self Care | Attending: Family Medicine

## 2021-08-01 DIAGNOSIS — I4892 Unspecified atrial flutter: Secondary | ICD-10-CM

## 2021-08-01 DIAGNOSIS — I499 Cardiac arrhythmia, unspecified: Secondary | ICD-10-CM | POA: Insufficient documentation

## 2021-08-01 HISTORY — PX: TOTAL HIP ARTHROPLASTY: SHX124

## 2021-08-01 LAB — CBC
HCT: 34.8 % — ABNORMAL LOW (ref 39.0–52.0)
HCT: 33 % — ABNORMAL LOW (ref 39.0–52.0)
Hemoglobin: 11.7 g/dL — ABNORMAL LOW (ref 13.0–17.0)
Hemoglobin: 11 g/dL — ABNORMAL LOW (ref 13.0–17.0)
MCH: 30.6 pg (ref 26.0–34.0)
MCH: 30.2 pg (ref 26.0–34.0)
MCHC: 33.6 g/dL (ref 30.0–36.0)
MCHC: 33.3 g/dL (ref 30.0–36.0)
MCV: 91.1 fL (ref 80.0–100.0)
MCV: 90.7 fL (ref 80.0–100.0)
Platelets: 184 10*3/uL (ref 150–400)
Platelets: 153 10*3/uL (ref 150–400)
RBC: 3.82 MIL/uL — ABNORMAL LOW (ref 4.22–5.81)
RBC: 3.64 MIL/uL — ABNORMAL LOW (ref 4.22–5.81)
RDW: 13.7 % (ref 11.5–15.5)
RDW: 13.8 % (ref 11.5–15.5)
WBC: 8.9 10*3/uL (ref 4.0–10.5)
WBC: 11.7 10*3/uL — ABNORMAL HIGH (ref 4.0–10.5)
nRBC: 0 % (ref 0.0–0.2)
nRBC: 0 % (ref 0.0–0.2)

## 2021-08-01 LAB — BASIC METABOLIC PANEL
Anion gap: 10 (ref 5–15)
BUN: 22 mg/dL (ref 8–23)
CO2: 22 mmol/L (ref 22–32)
Calcium: 8.5 mg/dL — ABNORMAL LOW (ref 8.9–10.3)
Chloride: 105 mmol/L (ref 98–111)
Creatinine, Ser: 1.07 mg/dL (ref 0.61–1.24)
GFR, Estimated: >60 mL/min (ref >60)
Glucose, Bld: 122 mg/dL — ABNORMAL HIGH (ref 70–99)
Potassium: 4.1 mmol/L (ref 3.5–5.1)
Sodium: 137 mmol/L (ref 135–145)

## 2021-08-01 LAB — CREATININE, SERUM
Creatinine, Ser: 1.02 mg/dL (ref 0.61–1.24)
GFR, Estimated: >60 mL/min (ref >60)

## 2021-08-01 LAB — VITAMIN D 25 HYDROXY (VIT D DEFICIENCY, FRACTURES): Vit D, 25-Hydroxy: 50.3 ng/mL (ref 30–100)

## 2021-08-01 SURGERY — ARTHROPLASTY, HIP, TOTAL, ANTERIOR APPROACH
Anesthesia: Spinal | Site: Hip | Laterality: Right

## 2021-08-01 MED ORDER — SODIUM CHLORIDE 0.9 % IV SOLN: INTRAVENOUS | Status: DC

## 2021-08-01 MED ORDER — SODIUM CHLORIDE FLUSH 0.9 % IV SOLN
INTRAVENOUS | Status: AC
  Filled 2021-08-01: qty 40

## 2021-08-01 MED ORDER — PHENOL 1.4 % MT LIQD
1.0000 | OROMUCOSAL | Status: DC | PRN
  Filled 2021-08-01: qty 177

## 2021-08-01 MED ORDER — PANTOPRAZOLE SODIUM 40 MG PO TBEC
40.0000 mg | DELAYED_RELEASE_TABLET | Freq: Every day | ORAL | Status: DC
  Administered 2021-08-02 – 2021-08-06 (×5): 40 mg via ORAL
  Filled 2021-08-01 (×5): qty 1

## 2021-08-01 MED ORDER — FENTANYL CITRATE (PF) 100 MCG/2ML IJ SOLN
INTRAMUSCULAR | Status: AC
  Administered 2021-08-01: 100 ug
  Filled 2021-08-01: qty 2

## 2021-08-01 MED ORDER — SURGIRINSE WOUND IRRIGATION SYSTEM - OPTIME
TOPICAL | Status: DC | PRN
  Administered 2021-08-01: 350 mL via TOPICAL

## 2021-08-01 MED ORDER — DOCUSATE SODIUM 100 MG PO CAPS
100.0000 mg | ORAL_CAPSULE | Freq: Two times a day (BID) | ORAL | Status: DC
  Administered 2021-08-01 – 2021-08-06 (×6): 100 mg via ORAL
  Filled 2021-08-01 (×9): qty 1

## 2021-08-01 MED ORDER — 0.9 % SODIUM CHLORIDE (POUR BTL) OPTIME
TOPICAL | Status: DC | PRN
  Administered 2021-08-01: 1000 mL

## 2021-08-01 MED ORDER — ALUM & MAG HYDROXIDE-SIMETH 200-200-20 MG/5ML PO SUSP: 30.0000 mL | ORAL | Status: DC | PRN

## 2021-08-01 MED ORDER — BUPIVACAINE LIPOSOME 1.3 % IJ SUSP
INTRAMUSCULAR | Status: AC
  Filled 2021-08-01: qty 20

## 2021-08-01 MED ORDER — FENTANYL CITRATE (PF) 100 MCG/2ML IJ SOLN: 25.0000 ug | INTRAMUSCULAR | Status: DC | PRN

## 2021-08-01 MED ORDER — LACTATED RINGERS IV SOLN: INTRAVENOUS | Status: DC | PRN

## 2021-08-01 MED ORDER — FENTANYL CITRATE (PF) 100 MCG/2ML IJ SOLN
INTRAMUSCULAR | Status: DC | PRN
  Administered 2021-08-01: 25 ug via INTRAVENOUS

## 2021-08-01 MED ORDER — NEOMYCIN-POLYMYXIN B GU 40-200000 IR SOLN
Status: AC
  Filled 2021-08-01: qty 4

## 2021-08-01 MED ORDER — CEFAZOLIN SODIUM-DEXTROSE 2-4 GM/100ML-% IV SOLN
INTRAVENOUS | Status: AC
  Filled 2021-08-01: qty 100

## 2021-08-01 MED ORDER — PROPOFOL 500 MG/50ML IV EMUL
INTRAVENOUS | Status: DC | PRN
  Administered 2021-08-01: 75 ug/kg/min via INTRAVENOUS

## 2021-08-01 MED ORDER — SODIUM CHLORIDE (PF) 0.9 % IJ SOLN
INTRAMUSCULAR | Status: DC | PRN
  Administered 2021-08-01: 90 mL

## 2021-08-01 MED ORDER — ONDANSETRON HCL 4 MG/2ML IJ SOLN
4.0000 mg | Freq: Four times a day (QID) | INTRAMUSCULAR | Status: DC | PRN
  Administered 2021-08-01: 4 mg via INTRAVENOUS
  Filled 2021-08-01: qty 2

## 2021-08-01 MED ORDER — TRANEXAMIC ACID 1000 MG/10ML IV SOLN
INTRAVENOUS | Status: AC
  Filled 2021-08-01: qty 10

## 2021-08-01 MED ORDER — BUPIVACAINE HCL (PF) 0.5 % IJ SOLN
INTRAMUSCULAR | Status: DC | PRN
  Administered 2021-08-01: 2.5 mL

## 2021-08-01 MED ORDER — PHENYLEPHRINE HCL-NACL 20-0.9 MG/250ML-% IV SOLN
INTRAVENOUS | Status: DC | PRN
  Administered 2021-08-01: 40 ug/min via INTRAVENOUS

## 2021-08-01 MED ORDER — DEXMEDETOMIDINE (PRECEDEX) IN NS 20 MCG/5ML (4 MCG/ML) IV SYRINGE
PREFILLED_SYRINGE | INTRAVENOUS | Status: DC | PRN
  Administered 2021-08-01: 4 ug via INTRAVENOUS

## 2021-08-01 MED ORDER — CEFAZOLIN SODIUM-DEXTROSE 2-4 GM/100ML-% IV SOLN
2.0000 g | Freq: Four times a day (QID) | INTRAVENOUS | Status: AC
  Administered 2021-08-01 – 2021-08-02 (×2): 2 g via INTRAVENOUS
  Filled 2021-08-01 (×2): qty 100

## 2021-08-01 MED ORDER — DIPHENHYDRAMINE HCL 12.5 MG/5ML PO ELIX: 12.5000 mg | ORAL_SOLUTION | ORAL | Status: DC | PRN

## 2021-08-01 MED ORDER — NEOMYCIN-POLYMYXIN B GU 40-200000 IR SOLN
Status: DC | PRN
  Administered 2021-08-01: 4 mL

## 2021-08-01 MED ORDER — ENOXAPARIN SODIUM 40 MG/0.4ML IJ SOSY
40.0000 mg | PREFILLED_SYRINGE | INTRAMUSCULAR | Status: DC
  Administered 2021-08-02 – 2021-08-06 (×5): 40 mg via SUBCUTANEOUS
  Filled 2021-08-01 (×5): qty 0.4

## 2021-08-01 MED ORDER — PROPOFOL 1000 MG/100ML IV EMUL
INTRAVENOUS | Status: AC
  Filled 2021-08-01: qty 100

## 2021-08-01 MED ORDER — BISACODYL 10 MG RE SUPP: 10.0000 mg | Freq: Every day | RECTAL | Status: DC | PRN

## 2021-08-01 MED ORDER — CEFAZOLIN SODIUM-DEXTROSE 2-4 GM/100ML-% IV SOLN
2.0000 g | Freq: Once | INTRAVENOUS | Status: AC
  Administered 2021-08-01: 2 g via INTRAVENOUS

## 2021-08-01 MED ORDER — FENTANYL CITRATE (PF) 100 MCG/2ML IJ SOLN
INTRAMUSCULAR | Status: AC
  Filled 2021-08-01: qty 2

## 2021-08-01 MED ORDER — ONDANSETRON HCL 4 MG PO TABS: 4.0000 mg | ORAL_TABLET | Freq: Four times a day (QID) | ORAL | Status: DC | PRN

## 2021-08-01 MED ORDER — MENTHOL 3 MG MT LOZG
1.0000 | LOZENGE | OROMUCOSAL | Status: DC | PRN
  Filled 2021-08-01: qty 9

## 2021-08-01 MED ORDER — FENTANYL CITRATE PF 50 MCG/ML IJ SOSY
25.0000 ug | PREFILLED_SYRINGE | INTRAMUSCULAR | Status: DC | PRN
  Administered 2021-08-01: 25 ug via INTRAVENOUS

## 2021-08-01 MED ORDER — FENTANYL CITRATE PF 50 MCG/ML IJ SOSY: 25.0000 ug | PREFILLED_SYRINGE | INTRAMUSCULAR | Status: DC | PRN

## 2021-08-01 MED ORDER — MAGNESIUM HYDROXIDE 400 MG/5ML PO SUSP
30.0000 mL | Freq: Every day | ORAL | Status: AC
  Filled 2021-08-01 (×4): qty 30

## 2021-08-01 MED ORDER — BUPIVACAINE-EPINEPHRINE (PF) 0.25% -1:200000 IJ SOLN
INTRAMUSCULAR | Status: AC
  Filled 2021-08-01: qty 30

## 2021-08-01 MED ORDER — KETAMINE HCL 10 MG/ML IJ SOLN
INTRAMUSCULAR | Status: DC | PRN
  Administered 2021-08-01: 20 mg via INTRAVENOUS

## 2021-08-01 MED ORDER — KETAMINE HCL 50 MG/5ML IJ SOSY
PREFILLED_SYRINGE | INTRAMUSCULAR | Status: AC
  Filled 2021-08-01: qty 5

## 2021-08-01 MED ORDER — PHENYLEPHRINE HCL (PRESSORS) 10 MG/ML IV SOLN
INTRAVENOUS | Status: DC | PRN
  Administered 2021-08-01 (×4): 160 ug via INTRAVENOUS

## 2021-08-01 SURGICAL SUPPLY — 69 items
BLADE SAGITTAL AGGR TOOTH XLG (BLADE) ×3 IMPLANT
BNDG COHESIVE 6X5 TAN ST LF (GAUZE/BANDAGES/DRESSINGS) ×9 IMPLANT
CANISTER WOUND CARE 500ML ATS (WOUND CARE) ×5 IMPLANT
CEMENT BONE 40GM (Cement) ×4 IMPLANT
CEMENT RESTRICTOR DEPUY SZ 4 (Cement) ×2 IMPLANT
CHLORAPREP W/TINT 26 (MISCELLANEOUS) ×3 IMPLANT
COVER BACK TABLE REUSABLE LG (DRAPES) ×3 IMPLANT
DRAPE 3/4 80X56 (DRAPES) ×9 IMPLANT
DRAPE C-ARM XRAY 36X54 (DRAPES) ×3 IMPLANT
DRAPE INCISE IOBAN 66X60 STRL (DRAPES) IMPLANT
DRAPE POUCH INSTRU U-SHP 10X18 (DRAPES) ×3 IMPLANT
DRESSING SURGICEL FIBRLLR 1X2 (HEMOSTASIS) ×2 IMPLANT
DRSG MEPILEX SACRM 8.7X9.8 (GAUZE/BANDAGES/DRESSINGS) ×3 IMPLANT
DRSG OPSITE POSTOP 4X8 (GAUZE/BANDAGES/DRESSINGS) ×6 IMPLANT
DRSG SURGICEL FIBRILLAR 1X2 (HEMOSTASIS) ×6
ELECT BLADE 6.5 EXT (BLADE) ×3 IMPLANT
ELECT REM PT RETURN 9FT ADLT (ELECTROSURGICAL) ×3
ELECTRODE REM PT RTRN 9FT ADLT (ELECTROSURGICAL) ×1 IMPLANT
GAUZE 4X4 16PLY ~~LOC~~+RFID DBL (SPONGE) ×3 IMPLANT
GLOVE SURG SYN 9.0  PF PI (GLOVE) ×4
GLOVE SURG SYN 9.0 PF PI (GLOVE) ×2 IMPLANT
GLOVE SURG UNDER POLY LF SZ9 (GLOVE) ×3 IMPLANT
GOWN SRG 2XL LVL 4 RGLN SLV (GOWNS) ×1 IMPLANT
GOWN STRL NON-REIN 2XL LVL4 (GOWNS) ×2
GOWN STRL REUS W/ TWL LRG LVL3 (GOWN DISPOSABLE) ×1 IMPLANT
GOWN STRL REUS W/TWL LRG LVL3 (GOWN DISPOSABLE) ×2
HEMOVAC 400CC 10FR (MISCELLANEOUS) IMPLANT
HIP FEM HD S 28 (Head) ×2 IMPLANT
HIP STEM FEM 3 STD (Stem) ×2 IMPLANT
HOLDER FOLEY CATH W/STRAP (MISCELLANEOUS) ×3 IMPLANT
KIT PREVENA INCISION MGT 13 (CANNISTER) ×5 IMPLANT
LINER DML 28MM HIGHCROSS (Liner) ×2 IMPLANT
MANIFOLD NEPTUNE II (INSTRUMENTS) ×3 IMPLANT
MAT ABSORB  FLUID 56X50 GRAY (MISCELLANEOUS) ×2
MAT ABSORB FLUID 56X50 GRAY (MISCELLANEOUS) ×1 IMPLANT
NDL SAFETY ECLIPSE 18X1.5 (NEEDLE) ×1 IMPLANT
NDL SPNL 20GX3.5 QUINCKE YW (NEEDLE) ×2 IMPLANT
NEEDLE HYPO 18GX1.5 SHARP (NEEDLE) ×2
NEEDLE SPNL 20GX3.5 QUINCKE YW (NEEDLE) ×6 IMPLANT
NS IRRIG 1000ML POUR BTL (IV SOLUTION) ×3 IMPLANT
PACK HIP COMPR (MISCELLANEOUS) ×3 IMPLANT
PRESSURIZER FEM CANAL M (MISCELLANEOUS) ×2 IMPLANT
SCALPEL PROTECTED #10 DISP (BLADE) ×6 IMPLANT
SHELL ACETABULAR DM  60MM (Shell) ×2 IMPLANT
SOL PREP PVP 2OZ (MISCELLANEOUS) ×3
SOLUTION PREP PVP 2OZ (MISCELLANEOUS) ×1 IMPLANT
SOLUTION PRONTOSAN WOUND 350ML (IRRIGATION / IRRIGATOR) IMPLANT
SPONGE DRAIN TRACH 4X4 STRL 2S (GAUZE/BANDAGES/DRESSINGS) ×3 IMPLANT
SPONGE T-LAP 18X18 ~~LOC~~+RFID (SPONGE) ×6 IMPLANT
STAPLER SKIN PROX 35W (STAPLE) ×3 IMPLANT
STRAP SAFETY 5IN WIDE (MISCELLANEOUS) ×3 IMPLANT
SUT DVC 2 QUILL PDO  T11 36X36 (SUTURE) ×2
SUT DVC 2 QUILL PDO T11 36X36 (SUTURE) ×1 IMPLANT
SUT SILK 0 (SUTURE) ×2
SUT SILK 0 30XBRD TIE 6 (SUTURE) ×1 IMPLANT
SUT SILK 2 0 (SUTURE) ×4
SUT SILK 2-0 18XBRD TIE 12 (SUTURE) IMPLANT
SUT SILK 2-0 30XBRD TIE 12 (SUTURE) IMPLANT
SUT V-LOC 90 ABS DVC 3-0 CL (SUTURE) ×3 IMPLANT
SUT VIC AB 1 CT1 36 (SUTURE) ×3 IMPLANT
SYR 20ML LL LF (SYRINGE) ×3 IMPLANT
SYR 30ML LL (SYRINGE) ×3 IMPLANT
SYR 50ML LL SCALE MARK (SYRINGE) ×6 IMPLANT
SYR BULB IRRIG 60ML STRL (SYRINGE) ×3 IMPLANT
TAPE MICROFOAM 4IN (TAPE) ×3 IMPLANT
TOWEL OR 17X26 4PK STRL BLUE (TOWEL DISPOSABLE) ×3 IMPLANT
TRAY FOLEY MTR SLVR 16FR STAT (SET/KITS/TRAYS/PACK) ×3 IMPLANT
WAND WEREWOLF FASTSEAL 6.0 (MISCELLANEOUS) ×2 IMPLANT
WATER STERILE IRR 500ML POUR (IV SOLUTION) ×3 IMPLANT

## 2021-08-01 NOTE — Anesthesia Procedure Notes (Addendum)
Spinal  Patient location during procedure: OR Start time: 08/01/2021 6:40 PM End time: 08/01/2021 6:43 PM Reason for block: surgical anesthesia Staffing Performed: resident/CRNA  Anesthesiologist: Martha Clan, MD Resident/CRNA: Jerrye Noble, CRNA Preanesthetic Checklist Completed: patient identified, IV checked, site marked, risks and benefits discussed, surgical consent, monitors and equipment checked, pre-op evaluation and timeout performed Spinal Block Patient position: sitting Prep: ChloraPrep Patient monitoring: heart rate, continuous pulse ox, blood pressure and cardiac monitor Approach: midline Location: L3-4 Injection technique: single-shot Needle Needle type: Introducer and Pencan  Needle gauge: 24 G Needle length: 9 cm Assessment Sensory level: T10 Events: CSF return Additional Notes Negative paresthesia. Negative blood return. Positive free-flowing CSF. Expiration date of kit checked and confirmed. Patient tolerated procedure well, without complications.

## 2021-08-01 NOTE — Plan of Care (Signed)
°  Problem: Education: Goal: Verbalization of understanding the information provided (i.e., activity precautions, restrictions, etc) will improve Outcome: Progressing Goal: Individualized Educational Video(s) Outcome: Progressing   Problem: Activity: Goal: Ability to ambulate and perform ADLs will improve Outcome: Progressing   Problem: Clinical Measurements: Goal: Postoperative complications will be avoided or minimized Outcome: Progressing   Problem: Self-Concept: Goal: Ability to maintain and perform role responsibilities to the fullest extent possible will improve Outcome: Progressing   Problem: Pain Management: Goal: Pain level will decrease Outcome: Progressing   Problem: Education: Goal: Knowledge of General Education information will improve Description: Including pain rating scale, medication(s)/side effects and non-pharmacologic comfort measures Outcome: Progressing   Problem: Health Behavior/Discharge Planning: Goal: Ability to manage health-related needs will improve Outcome: Progressing   Problem: Clinical Measurements: Goal: Ability to maintain clinical measurements within normal limits will improve Outcome: Progressing Goal: Will remain free from infection Outcome: Progressing Goal: Diagnostic test results will improve Outcome: Progressing Goal: Respiratory complications will improve Outcome: Progressing Goal: Cardiovascular complication will be avoided Outcome: Progressing   Problem: Activity: Goal: Risk for activity intolerance will decrease Outcome: Progressing   Problem: Nutrition: Goal: Adequate nutrition will be maintained Outcome: Progressing   Problem: Coping: Goal: Level of anxiety will decrease Outcome: Progressing   Problem: Elimination: Goal: Will not experience complications related to bowel motility Outcome: Progressing Goal: Will not experience complications related to urinary retention Outcome: Progressing   Problem: Pain  Managment: Goal: General experience of comfort will improve Outcome: Progressing   Problem: Safety: Goal: Ability to remain free from injury will improve Outcome: Progressing   Problem: Skin Integrity: Goal: Risk for impaired skin integrity will decrease Outcome: Progressing   Problem: Education: Goal: Knowledge of the prescribed therapeutic regimen will improve Outcome: Progressing Goal: Understanding of discharge needs will improve Outcome: Progressing Goal: Individualized Educational Video(s) Outcome: Progressing   Problem: Activity: Goal: Ability to avoid complications of mobility impairment will improve Outcome: Progressing Goal: Ability to tolerate increased activity will improve Outcome: Progressing   Problem: Clinical Measurements: Goal: Postoperative complications will be avoided or minimized Outcome: Progressing   Problem: Pain Management: Goal: Pain level will decrease with appropriate interventions Outcome: Progressing   Problem: Skin Integrity: Goal: Will show signs of wound healing Outcome: Progressing   

## 2021-08-01 NOTE — Progress Notes (Signed)
Pt BP 91/47 with MAP of 58. Hassan Rowan Morrison/NP made aware. No new orders received at this time.Will cont to monitor pt.

## 2021-08-01 NOTE — Progress Notes (Signed)
Initial Nutrition Assessment  DOCUMENTATION CODES:   Not applicable  INTERVENTION:   -Once diet is advanced:  -Magic cup BID with meals, each supplement provides 290 kcal and 9 grams of protein  -MVI with minerals daily  NUTRITION DIAGNOSIS:   Increased nutrient needs related to post-op healing as evidenced by estimated needs.  GOAL:   Patient will meet greater than or equal to 90% of their needs  MONITOR:   PO intake, Supplement acceptance, Labs, Weight trends, Skin, I & O's  REASON FOR ASSESSMENT:   Consult Assessment of nutrition requirement/status  ASSESSMENT:   Jared Castro is a 87 y.o. male with medical history significant for hypertension, history of paroxysmal SVT, nonischemic dilated cardiomyopathy, typical atrial flutter, biventricular ICD in place, CAD, history of hyperlipidemia, ileostomy in place with diagnosis of ulcerative colitis, chronic midline low back pain without sciatica, who presents to the emergency department from [facility] for chief concerns of a fall while doing laundry.  Pt admitted with rt femur fracture.   Reviewed I/O's: -260 ml x 24 hours   UOP: 500 ml x 24 hours  Per orthopedics notes, plan for surgery later today or tomorrow. Pt currently NPO in anticipation for procedure.    Spoke with pt and wife at bedside. Pt reports good appetite usually consumes 3 meals and snacks per day.   Per pt, UBW is around 160#> He shares that he intentionally lost 20 pounds over the past 2 years secondary to the COVID-19 pandemic by eating healthfully and walking 2 miles per day.   Discussed importance of good meal and supplement intake  to promote healing.   Medications reviewed and include vitamin D and magnesium oxide.   Labs reviewed.   NUTRITION - FOCUSED PHYSICAL EXAM:  Flowsheet Row Most Recent Value  Orbital Region No depletion  Upper Arm Region Mild depletion  Buccal Region No depletion  Temple Region No depletion  Clavicle Bone  Region No depletion  Clavicle and Acromion Bone Region No depletion  Scapular Bone Region No depletion  Dorsal Hand No depletion  Patellar Region No depletion  Anterior Thigh Region No depletion  Posterior Calf Region No depletion  Edema (RD Assessment) None  Hair Reviewed  Eyes Reviewed  Mouth Reviewed  Skin Reviewed  Nails Reviewed       Diet Order:   Diet Order             Diet NPO time specified  Diet effective now                   EDUCATION NEEDS:   Education needs have been addressed  Skin:  Skin Assessment: Reviewed RN Assessment  Last BM:  07/31/21 (via ileostomy)  Height:   Ht Readings from Last 1 Encounters:  07/31/21 5\' 8"  (1.727 m)    Weight:   Wt Readings from Last 1 Encounters:  07/31/21 72.7 kg    Ideal Body Weight:  70 kg  BMI:  Body mass index is 24.37 kg/m.  Estimated Nutritional Needs:   Kcal:  1800-2000  Protein:  90-105 grams  Fluid:  > 1.8 L    Loistine Chance, RD, LDN, [place] Registered Dietitian II Certified Diabetes Care and Education Specialist Please refer to [facility] for RD and/or RD on-call/weekend/after hours pager

## 2021-08-01 NOTE — Progress Notes (Addendum)
PROGRESS NOTE    Vashawn Ekstein  CNO:[id number] DOB: [birth year] DOA: 07/31/2021 PCP: Adin Hector, MD  Chief Complaint  Patient presents with   Fall    Brief Narrative:  87 yo with hx NICM (EF 30-35%, unclear when this echo was done), hx paroxysmal SVT, atrial flutter, biventricular ICD, CAD, HLD and multiple other medical issues presenting after mechanical fallw with right hip fracture.    Assessment & Plan:   Principal Problem:   Right femoral fracture (HCC) Active Problems:   Nonischemic dilated cardiomyopathy (HCC)   ICD (implantable cardioverter-defibrillator) in place   Essential hypertension   HLD (hyperlipidemia)   Chronic midline low back pain   Atrial flutter (HCC)   * Right femoral fracture (HCC) Mechanical fall, no head trauma Baseline, walks 2 miles Chabeli Barsamian day, estimates he can walk Saivion Goettel few flights of stairs RCRI 1 with decreased EF (unclear what most recent EF is, most recent I see documented in 06/2021 note is 30-35%, but I don't see when that was done).  With good baseline functional status, I think benefits outweigh risk, cardiology has been c/s by orthopedics, will discuss prior to surgery. Pain regimen, bowel regimen post op dvt ppx per ortho  Nonischemic dilated cardiomyopathy ([place]) Follows with Dr. Ubaldo Glassing, [facility] EF 30-35%, unclear when this echo was done, will discuss with cards   ICD (implantable cardioverter-defibrillator) in place noted  Essential hypertension Hold lostartan Continue metop  HLD (hyperlipidemia) Doesn't appear to be on statin  Atrial flutter ([place]) History noted, doesn't appear to be on anticoagulation Per cards  DVT prophylaxis: heparin Code Status: full Family Communication: daughters at bedside Disposition:   Status is: Inpatient  Remains inpatient appropriate because: need for ortho        Consultants:  Cardiology orthopedics  Procedures:  none  Antimicrobials:  Anti-infectives (From admission,  onward)    Start     Dose/Rate Route Frequency Ordered Stop   08/02/21 0915  ceFAZolin (ANCEF) IVPB 2g/100 mL premix        2 g 200 mL/hr over 30 Minutes Intravenous  Once 08/01/21 0744         Subjective: No complaints Pain ok right now  Objective: Vitals:   07/31/21 2350 08/01/21 0213 08/01/21 0733 08/01/21 1055  BP: 107/68 128/82 120/67 129/66  Pulse: (!) 104 (!) 110 (!) 109 88  Resp: 16 16 18 18   Temp: 97.6 F (36.4 C) 97.8 F (36.6 C) 98.6 F (37 C) 98.7 F (37.1 C)  TempSrc:      SpO2: 95% 95% 96% 97%  Weight:      Height:        Intake/Output Summary (Last 24 hours) at 08/01/2021 1434 Last data filed at 08/01/2021 1022 Gross per 24 hour  Intake 360 ml  Output 500 ml  Net -140 ml   Filed Weights   07/31/21 2059  Weight: 72.7 kg    Examination:  General exam: Appears calm and comfortable  Respiratory system: unlabored Cardiovascular system: RRR Gastrointestinal system: Abdomen is nondistended, soft and nontender.  Central nervous system: Alert and oriented. No focal neurological deficits. Extremities: no LEE, TTP to R hip Psychiatry: Judgement and insight appear normal. Mood & affect appropriate.     Data Reviewed: I have personally reviewed following labs and imaging studies  CBC: Recent Labs  Lab 07/31/21 1816 08/01/21 0545  WBC 10.2 8.9  NEUTROABS 8.6*  --   HGB 12.3* 11.7*  HCT 37.3* 34.8*  MCV 92.3 91.1  PLT 205 294    Basic Metabolic Panel: Recent Labs  Lab 07/31/21 1816 08/01/21 0545  NA 137 137  K 4.1 4.1  CL 105 105  CO2 22 22  GLUCOSE 109* 122*  BUN 22 22  CREATININE 1.04 1.07  CALCIUM 8.9 8.5*    GFR: Estimated Creatinine Clearance: 47.9 mL/min (by C-G formula based on SCr of 1.07 mg/dL).  Liver Function Tests: No results for input(s): AST, ALT, ALKPHOS, BILITOT, PROT, ALBUMIN in the last 168 hours.  CBG: No results for input(s): GLUCAP in the last 168 hours.   Recent Results (from the past 240 hour(s))   Resp Panel by RT-PCR (Flu Raiza Kiesel&B, Covid) Nasopharyngeal Swab     Status: None   Collection Time: 07/31/21  6:17 PM   Specimen: Nasopharyngeal Swab; Nasopharyngeal(NP) swabs in vial transport medium  Result Value Ref Range Status   SARS Coronavirus 2 by RT PCR NEGATIVE NEGATIVE Final    Comment: (NOTE) SARS-CoV-2 target nucleic acids are NOT DETECTED.  The SARS-CoV-2 RNA is generally detectable in upper respiratory specimens during the acute phase of infection. The lowest concentration of SARS-CoV-2 viral copies this assay can detect is 138 copies/mL. Amare Kontos negative result does not preclude SARS-Cov-2 infection and should not be used as the sole basis for treatment or other patient management decisions. Kierre Deines negative result may occur with  improper specimen collection/handling, submission of specimen other than nasopharyngeal swab, presence of viral mutation(s) within the areas targeted by this assay, and inadequate number of viral copies(<138 copies/mL). Cherene Dobbins negative result must be combined with clinical observations, patient history, and epidemiological information. The expected result is Negative.  Fact Sheet for Patients:  EntrepreneurPulse.com.au  Fact Sheet for Healthcare Providers:  IncredibleEmployment.be  This test is no t yet approved or cleared by the [place] FDA and  has been authorized for detection and/or diagnosis of SARS-CoV-2 by FDA under an Emergency Use Authorization (EUA). This EUA will remain  in effect (meaning this test can be used) for the duration of the COVID-19 declaration under Section 564(b)(1) of the Act, 21 U.S.C.section 360bbb-3(b)(1), unless the authorization is terminated  or revoked sooner.       Influenza Katharin Schneider by PCR NEGATIVE NEGATIVE Final   Influenza B by PCR NEGATIVE NEGATIVE Final    Comment: (NOTE) The Xpert Xpress SARS-CoV-2/FLU/RSV plus assay is intended as an aid in the diagnosis of influenza from  Nasopharyngeal swab specimens and should not be used as Garl Speigner sole basis for treatment. Nasal washings and aspirates are unacceptable for Xpert Xpress SARS-CoV-2/FLU/RSV testing.  Fact Sheet for Patients: EntrepreneurPulse.com.au  Fact Sheet for Healthcare Providers: IncredibleEmployment.be  This test is not yet approved or cleared by the [place] FDA and has been authorized for detection and/or diagnosis of SARS-CoV-2 by FDA under an Emergency Use Authorization (EUA). This EUA will remain in effect (meaning this test can be used) for the duration of the COVID-19 declaration under Section 564(b)(1) of the Act, 21 U.S.C. section 360bbb-3(b)(1), unless the authorization is terminated or revoked.  Performed at [facility], [address], [place], [place] [address]          Radiology Studies: DG Chest 1 View  Result Date: 07/31/2021 CLINICAL DATA:  Hip fracture EXAM: CHEST  1 VIEW COMPARISON:  None. FINDINGS: Left-sided pacing device. No focal opacity, pleural effusion or pneumothorax. Normal cardiac size. IMPRESSION: No active disease. Electronically Signed   By: Donavan Foil M.D.   On: 07/31/2021 18:05   DG Hip  Unilat  With Pelvis 2-3 Views Right  Result Date: 07/31/2021 CLINICAL DATA:  Fall EXAM: DG HIP (WITH OR WITHOUT PELVIS) 2-3V RIGHT COMPARISON:  None. FINDINGS: Postsurgical changes over the pelvis. Multiple phleboliths. Pubic symphysis and rami appear intact. Acute right subcapital femoral neck fracture with cranial displacement of the trochanter. No femoral head dislocation. Probable right lower quadrant ostomy. IMPRESSION: Acute displaced right femoral neck fracture. Electronically Signed   By: Donavan Foil M.D.   On: 07/31/2021 18:05        Scheduled Meds:  cholecalciferol  1,000 Units Oral Daily   heparin  5,000 Units Subcutaneous Q8H   magnesium oxide  400 mg Oral BID   metoprolol succinate  25 mg Oral BID    mometasone-formoterol  2 puff Inhalation BID   multivitamin with minerals  1 tablet Oral Daily   Continuous Infusions:  [START ON 08/02/2021]  ceFAZolin (ANCEF) IV     promethazine (PHENERGAN) injection (IM or IVPB)       LOS: 1 day    Time spent: over 30 min    Fayrene Helper, MD [facility]   To contact the attending provider between 7A-7P or the covering provider during after hours 7P-7A, please log into the web site [email or web address] and access using universal [place] password for that web site. If you do not have the password, please call the hospital operator.  08/01/2021, 2:34 PM

## 2021-08-01 NOTE — Anesthesia Preprocedure Evaluation (Addendum)
Anesthesia Evaluation  Patient identified by MRN, date of birth, ID band Patient awake    Reviewed: Allergy & Precautions, NPO status , Patient's Chart, lab work & pertinent test results, reviewed documented beta blocker date and time   History of Anesthesia Complications Negative for: history of anesthetic complications  Airway Mallampati: II  TM Distance: >3 FB Neck ROM: limited    Dental  (+) Dental Advidsory Given, Poor Dentition, Missing, Caps Permanent bridges:   Pulmonary neg pulmonary ROS, former smoker,           Cardiovascular hypertension, Pt. on medications and Pt. on home beta blockers (-) angina+CHF  (-) Past MI and (-) Cardiac Stents + dysrhythmias + pacemaker + Cardiac Defibrillator (-) Valvular Problems/Murmurs   Idiopathic dilated cardiomyopathy with left bundle branch block status post defibrillator pacemaker and CRT-D placement. Last echocardiogram showed global LV dysfunction with ejection fraction of 35% and not significantly changed from before with no evidence of significant valvular heart disease.  Pt was evaluated by cardiology inpatient with no concerns to proceed. -" Would be okay for either no changes in defibrillator pacemaker device and and/or placement of magnet if needed.  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

## 2021-08-01 NOTE — Consult Note (Signed)
[facility] Cardiology Consultation Note  Patient ID: Jared Castro, MRN: [id number], DOB/AGE: [birth year] 87 y.o. Admit date: 07/31/2021   Date of Consult: 08/01/2021 Primary Physician: Adin Hector, MD Primary Cardiologist: Fath  Chief Complaint:  Chief Complaint  Patient presents with   Fall   Reason for Consult:  Cardiomyopathy  HPI: 87 y.o. male with known idiopathic dilated cardiomyopathy with left bundle branch block status post defibrillator pacemaker and CRT-D placement in the past.  He also had a cardiac catheterization with no evidence of significant coronary artery disease.  Last echocardiogram showed global LV dysfunction with ejection fraction of 35% and not significantly changed from before with no evidence of significant valvular heart disease.  He was placed on appropriate medication in the past for cardiomyopathy including metoprolol and angiotensin receptor blocker.  This has worked fairly well for him and he is not had any significant recent symptoms of angina and/or congestive heart failure.  His fluid balances have been quite good and not needing any diuretics.  Recently he fell and broke his hip which is needing surgical intervention at this time.  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 Authorizing Provider  Cholecalciferol 25 MCG (1000 UT) tablet Take 1 tablet by mouth daily.   Yes [provider]  losartan (COZAAR) 25 MG tablet Take 25 mg by mouth daily.   Yes [provider]  Magnesium 250 MG TABS Take 1 tablet by mouth in the morning and at bedtime. 03/27/21  Yes [provider]  metoprolol succinate (TOPROL-XL) 25 MG 24 hr tablet Take 25 mg by mouth 2 (two) times daily. 04/23/21  Yes [provider]  Multiple Vitamin (MULTIVITAMIN) tablet Take by mouth.   Yes [provider]  Multiple Vitamins-Minerals (PRESERVISION AREDS 2 PO) Take 1 capsule by mouth in the morning and at bedtime.   Yes [provider]  albuterol (VENTOLIN HFA) 108 (90 Base) MCG/ACT inhaler Inhale into the lungs. Patient not taking: Reported on 07/31/2021 06/27/14   [provider]  aspirin 81 MG EC tablet Take by mouth. Patient not taking: Reported on 07/31/2021    [provider]  calcipotriene (DOVONOX) 0.005 % ointment  09/25/15   [provider]  desonide (DESOWEN) 0.05 % cream APPLY EXTERNALLY TO THE AFFECTED AREA DAILY TO TWICE DAILY AS DIRECTED AS NEEDED FOR RASH Patient not taking: Reported on 07/31/2021 12/19/19   Ralene Bathe, MD  digoxin (LANOXIN) 0.25 MG tablet Take by mouth. Patient not taking: Reported on 07/31/2021 11/09/12   [provider]  fluconazole (DIFLUCAN) 200 MG tablet TAKE 1 TABLET(200 MG) BY MOUTH 3 TIMES A WEEK Patient not taking: Reported on 07/31/2021 01/18/20   Ralene Bathe, MD  fluocinonide ointment (LIDEX) 0.05 % Apply topically. Patient not taking: Reported on 07/31/2021 06/24/16   [provider]  Fluorouracil 5 %  SOLN Apply to scalp twice daily with calcipotriene solution for 4 days Patient not taking: Reported on 07/31/2021 09/25/15   [provider]  fluticasone (FLONASE) 50 MCG/ACT nasal spray INSTILL 2 SPRAYS INTO EACH NOSTRIL ONCE DAILY Patient not taking: Reported on 07/31/2021 10/09/16   [provider]  fluticasone-salmeterol (ADVAIR HFA) 115-21 MCG/ACT  inhaler Inhale into the lungs. Patient not taking: Reported on 07/31/2021 07/25/14   [provider]  ketoconazole (NIZORAL) 2 % cream Apply 1 application topically 2 (two) times daily. Patient not taking: Reported on 07/31/2021    [provider]  lisinopril (ZESTRIL) 5 MG tablet Take by mouth. Patient not taking: Reported on 07/31/2021 12/10/15   [provider]  Metoprolol Succinate 25 MG CS24 Take by mouth. Patient not taking: Reported on 07/31/2021    [provider]  minocycline (MINOCIN) 100 MG capsule Take by mouth. Patient not taking: Reported on 07/31/2021 09/25/15   [provider]  Misc. Devices MISC Ostomy supplies (post partial colectomy): Convatec Pouch [id number], Convatec  Durahesive  [phone], Convatec [place] Seals, and  Microporre tape; ICD 10 is Z90.49 11/25/17   [provider]  pimecrolimus (ELIDEL) 1 % cream APPLY EXTERNALLY TO THE AFFECTED AREA TWICE DAILY Patient not taking: Reported on 07/31/2021 03/11/20   Ralene Bathe, MD  Probiotic Product (EQ PROBIOTIC) CAPS Take by mouth. Patient not taking: Reported on 07/31/2021    [provider]  tretinoin (RETIN-A) 0.025 % cream Apply topically. Patient not taking: Reported on 07/31/2021 02/12/16   [provider]  triamcinolone cream (KENALOG) 0.1 % as needed Patient not taking: Reported on 07/31/2021 05/17/17   [provider]    Inpatient Medications:   cholecalciferol  1,000 Units Oral Daily   heparin  5,000 Units Subcutaneous Q8H   magnesium oxide  400 mg Oral BID   metoprolol succinate  25 mg Oral BID   mometasone-formoterol  2 puff Inhalation BID   multivitamin with minerals  1 tablet Oral Daily    [START ON 08/02/2021]  ceFAZolin (ANCEF) IV     promethazine (PHENERGAN) injection (IM or IVPB)      Allergies: No Known Allergies  Social History   Socioeconomic History   Marital status: Married    Spouse name: Not on file   Number of  children: Not on file   Years of education: Not on file   Highest education level: Not on file  Occupational History   Not on file  Tobacco Use   Smoking status: Former    Types: Cigarettes   Smokeless tobacco: Never  Substance and Sexual Activity   Alcohol use: Yes    Alcohol/week: 1.0 standard drink    Types: 1 Glasses of wine per week   Drug use: Never   Sexual activity: Not Currently  Other Topics Concern   Not on file  Social History Narrative   Not on file   Social Determinants of Health   Financial Resource Strain: Not on file  Food Insecurity: Not on file  Transportation Needs: Not on file  Physical Activity: Not on file  Stress: Not on file  Social Connections: Not on file  Intimate Partner Violence: Not on file     Family History  Problem Relation Age of Onset   Hypertension Mother    Cancer Mother      Review of Systems Positive for limb pain Negative for: General:  chills, fever, night sweats or weight changes.  Cardiovascular: PND orthopnea syncope dizziness  Dermatological skin lesions rashes Respiratory: Cough congestion Urologic: Frequent urination urination at night and hematuria Abdominal: negative for nausea, vomiting, diarrhea, bright red blood per rectum, melena, or hematemesis Neurologic: negative for visual changes, and/or hearing changes  All other systems reviewed and are otherwise negative except as noted above.  Labs: No results for input(s): CKTOTAL, CKMB, TROPONINI in the last 72 hours. Lab Results  Component Value Date   WBC 8.9 08/01/2021   HGB 11.7 (L) 08/01/2021   HCT 34.8 (L) 08/01/2021   MCV 91.1 08/01/2021   PLT 184 08/01/2021    Recent Labs  Lab 08/01/21 0545  NA 137  K 4.1  CL 105  CO2 22  BUN 22  CREATININE 1.07  CALCIUM 8.5*  GLUCOSE 122*   No results found for: CHOL, HDL, LDLCALC, TRIG No results found for: DDIMER  Radiology/Studies:  DG Chest 1 View  Result Date: 07/31/2021 CLINICAL DATA:  Hip  fracture EXAM: CHEST  1 VIEW COMPARISON:  None. FINDINGS: Left-sided pacing device. No focal opacity, pleural effusion or pneumothorax. Normal cardiac size. IMPRESSION: No active disease. Electronically Signed   By: Donavan Foil M.D.   On: 07/31/2021 18:05   DG Hip Unilat  With Pelvis 2-3 Views Right  Result Date: 07/31/2021 CLINICAL DATA:  Fall EXAM: DG HIP (WITH OR WITHOUT PELVIS) 2-3V RIGHT COMPARISON:  None. FINDINGS: Postsurgical changes over the pelvis. Multiple phleboliths. Pubic symphysis and rami appear intact. Acute right subcapital femoral neck fracture with cranial displacement of the trochanter. No femoral head dislocation. Probable right lower quadrant ostomy. IMPRESSION: Acute displaced right femoral neck fracture. Electronically Signed   By: Donavan Foil M.D.   On: 07/31/2021 18:05    EKG: Sinus rhythm with paced ventricles  Weights: Filed Weights   07/31/21 2059  Weight: 72.7 kg     Physical Exam: Blood pressure 129/66, pulse 88, temperature 98.7 F (37.1 C), resp. rate 18, height 5\' 8"  (1.727 m), weight 72.7 kg, SpO2 97 %. Body mass index is 24.37 kg/m. General: Well developed, well nourished, in no acute distress. Head eyes ears nose throat: Normocephalic, atraumatic, sclera non-icteric, no xanthomas, nares are without discharge. No apparent thyromegaly and/or mass  Lungs: Normal respiratory effort.  no wheezes, no rales, no rhonchi.  Heart: RRR with normal S1 S2. no murmur gallop, no rub, PMI is normal size and placement, carotid upstroke normal without bruit, jugular venous pressure is normal Abdomen: Soft, non-tender, non-distended with normoactive bowel sounds. No hepatomegaly. No rebound/guarding. No obvious abdominal masses. Abdominal aorta is normal size without bruit Extremities: No edema. no cyanosis, no clubbing, no ulcers  Peripheral : 2+ bilateral upper extremity pulses, 2+ bilateral femoral pulses, 2+ bilateral dorsal pedal pulse Neuro: Alert and oriented.  No facial asymmetry. No focal deficit. Moves all extremities spontaneously. Musculoskeletal: Normal muscle tone without kyphosis Psych:  Responds to questions appropriately with a normal affect.    Assessment: 87 year old male with known dilated cardiomyopathy idiopathic in nature without evidence of significant coronary artery disease on appropriate medication management without current evidence of acute coronary syndrome non-ST elevation myocardial infarction angina and/or congestive heart failure symptoms at lowest risk possible for cardiovascular complication with surgical intervention of fracture  Plan: 1.  Proceed to orthopedic surgery for fracture without restriction due to no evidence of current symptoms from the cardiovascular standpoint and no restrictions to rehabilitation 2.  Continue medication management for previous cardiomyopathy including metoprolol 3.  Would be okay for either no changes in defibrillator pacemaker device and  and/or placement of magnet if needed.  Currently there is no concerns for need for reprogramming 4.  No further diagnostic testing necessary at this time  Signed, Corey Skains M.D. [facility] 08/01/2021, 2:49 PM

## 2021-08-01 NOTE — Progress Notes (Signed)
Vitals taken by Glade Lloyd

## 2021-08-01 NOTE — Assessment & Plan Note (Signed)
noted 

## 2021-08-01 NOTE — Assessment & Plan Note (Addendum)
Mechanical fall, no head trauma S/p total hip arthroplasty anterior approach 12/16 Pain regimen, bowel regimen lovenox Pain meds, bowel regimen Ortho c/s, appreciate recs - recommending removing negative pressure dressing on 12/26 and apply honey comb dressing.  Keep clean and dry.  Ortho f/u in 2 weeks.  Lovenox x14 days.

## 2021-08-01 NOTE — Progress Notes (Addendum)
Patient's pain under control.  Plan surgery tomorrow a.m.  Orders placed.  Surgery has had OR time open up, plan surgery today

## 2021-08-01 NOTE — Plan of Care (Signed)
°  Problem: Activity: Goal: Ability to ambulate and perform ADLs will improve Outcome: Progressing   Problem: Self-Concept: Goal: Ability to maintain and perform role responsibilities to the fullest extent possible will improve Outcome: Progressing   Problem: Pain Management: Goal: Pain level will decrease Outcome: Progressing   Problem: Education: Goal: Knowledge of General Education information will improve Description: Including pain rating scale, medication(s)/side effects and non-pharmacologic comfort measures Outcome: Progressing   Problem: Nutrition: Goal: Adequate nutrition will be maintained Outcome: Progressing   Problem: Pain Managment: Goal: General experience of comfort will improve Outcome: Progressing   Problem: Safety: Goal: Ability to remain free from injury will improve Outcome: Progressing   Problem: Skin Integrity: Goal: Risk for impaired skin integrity will decrease Outcome: Progressing

## 2021-08-01 NOTE — Assessment & Plan Note (Signed)
Doesn't appear to be on statin

## 2021-08-01 NOTE — Assessment & Plan Note (Addendum)
Hold lostartan Hold metop, consider resumption later if improved

## 2021-08-01 NOTE — Progress Notes (Signed)
Called by staff to provide presence and support for wife as she waited for husband who is currently in surgery. We shared a mutually enjoyable conversation, I ended the visit with prayer, will follow up.

## 2021-08-01 NOTE — Transfer of Care (Signed)
Immediate Anesthesia Transfer of Care Note  Patient: Jared Castro  Procedure(s) Performed: TOTAL HIP ARTHROPLASTY ANTERIOR APPROACH (Right: Hip)  Patient Location: PACU  Anesthesia Type:General and Spinal  Level of Consciousness: awake, alert  and oriented  Airway & Oxygen Therapy: Patient Spontanous Breathing and Patient connected to nasal cannula oxygen  Post-op Assessment: Report given to RN and Post -op Vital signs reviewed and stable  Post vital signs: Reviewed and stable  Last Vitals:  Vitals Value Taken Time  BP    Temp    Pulse 84 08/01/21 2015  Resp 16 08/01/21 2015  SpO2 92 % 08/01/21 2015    Last Pain:  Vitals:   08/01/21 1734  TempSrc:   PainSc: 6       Patients Stated Pain Goal: 0 (27/67/01 1003)  Complications: No notable events documented.

## 2021-08-01 NOTE — Hospital Course (Addendum)
87 yo with hx NICM (EF 30-35%, unclear when this echo was done), hx paroxysmal SVT, atrial flutter, biventricular ICD, CAD, HLD and multiple other medical issues presenting after mechanical fall with right hip fracture.  He's now s/p repair by orthopedics.  Post op course complicated by delirium with hallucinations and post op anemia, receiving transfusion on 12/20.  I suspect he'll be able to discharge home to [facility] with home health on 12/21 pending reevaluation by next provider.  See below for additional details

## 2021-08-01 NOTE — Assessment & Plan Note (Addendum)
Follows with Dr. Ubaldo Glassing, [facility] Hx EF 30-35%, unclear when this echo was done Repeat echo with EF 62-83%, grade 1 diastolic dysfunction Appreciate cards recs (12/17 sign off)

## 2021-08-01 NOTE — Assessment & Plan Note (Addendum)
?   Hx flutter --- I don't see this elsewhere in chart, will remove - he denies any history - I don't see it in my review of care everywhere notes from Dr. Ubaldo Glassing -> he does have hx NSVT

## 2021-08-01 NOTE — Op Note (Signed)
08/01/2021  8:13 PM  PATIENT:  Jared Castro  87 y.o. male  PRE-OPERATIVE DIAGNOSIS:  Right displaced femoral neck fracture with osteoarthritis  POST-OPERATIVE DIAGNOSIS: Same  PROCEDURE:  Procedure(s): TOTAL HIP ARTHROPLASTY ANTERIOR APPROACH (Right)  SURGEON: Laurene Footman, MD  ASSISTANTS: None  ANESTHESIA:   spinal  EBL:  Total I/O In: 300 [I.V.:300] Out: -   BLOOD ADMINISTERED:none  DRAINS:  Incisional wound VAC    LOCAL MEDICATIONS USED:  MARCAINE    and OTHER Exparel  SPECIMEN: Right femoral head and neck fragments  DISPOSITION OF SPECIMEN:  PATHOLOGY  COUNTS:  YES  TOURNIQUET:  * No tourniquets in log *  IMPLANTS: Medacta cemented AMIS 3 standard stem with 4 DePuy cement plug, 60 mm Mpact DM cup and liner with metal S 28 mm head  DICTATION: .Dragon Dictation   The patient was brought to the operating room and after spinal anesthesia was obtained patient was placed on the operative table with the ipsilateral foot into the Medacta attachment, contralateral leg on a well-padded table. C-arm was brought in and preop template x-ray taken. After prepping and draping in usual sterile fashion appropriate patient identification and timeout procedures were completed. Anterior approach to the hip was obtained and centered over the greater trochanter and TFL muscle. The subcutaneous tissue was incised hemostasis being achieved by electrocautery. TFL fascia was incised and the muscle retracted laterally deep retractor placed. The lateral femoral circumflex vessels were identified and ligated. The anterior capsule was exposed and a capsulotomy performed.  There was a subcapital displaced fracture of the femoral head.  The neck was identified and a femoral neck cut carried out with a saw. The head was removed without difficulty and showed sclerotic femoral head and acetabulum. Reaming was carried out to 60 mm and a 60 mm cup trial gave appropriate tightness to the acetabular component  a 60 DM cup was impacted into position. The leg was then externally rotated and ischiofemoral and pubofemoral releases carried out. The femur was sequentially broached to a size 4, size 4 standard with S head trials were placed and the final components chosen.  Canal preparation for cement was carried out with a cement restrictor placed then drying the canal, cement inserted and pressurized.  The 3 cemented stem was inserted along with a metal S 28 mm head and 60 mm liner after cement had set around the stem and excess cement i was removed. The hip was reduced and was stable the wound was thoroughly irrigated with fibrillar placed along the posterior capsule and medial neck. The deep fascia ws closed using a heavy Quill after infiltration of 30 cc of quarter percent Sensorcaine with Exparel throughout the case, .3-0 V-loc to close the skin with skin staples.  Incisional wound VAC applied and patient was sent to recovery in stable condition.   PLAN OF CARE: Admit to inpatient

## 2021-08-02 DIAGNOSIS — R410 Disorientation, unspecified: Secondary | ICD-10-CM

## 2021-08-02 DIAGNOSIS — I959 Hypotension, unspecified: Secondary | ICD-10-CM

## 2021-08-02 LAB — COMPREHENSIVE METABOLIC PANEL
ALT: 19 U/L (ref 0–44)
AST: 25 U/L (ref 15–41)
Albumin: 2.9 g/dL — ABNORMAL LOW (ref 3.5–5.0)
Alkaline Phosphatase: 48 U/L (ref 38–126)
Anion gap: 7 (ref 5–15)
BUN: 22 mg/dL (ref 8–23)
CO2: 24 mmol/L (ref 22–32)
Calcium: 7.8 mg/dL — ABNORMAL LOW (ref 8.9–10.3)
Chloride: 106 mmol/L (ref 98–111)
Creatinine, Ser: 1.06 mg/dL (ref 0.61–1.24)
GFR, Estimated: >60 mL/min (ref >60)
Glucose, Bld: 116 mg/dL — ABNORMAL HIGH (ref 70–99)
Potassium: 3.9 mmol/L (ref 3.5–5.1)
Sodium: 137 mmol/L (ref 135–145)
Total Bilirubin: 1.4 mg/dL — ABNORMAL HIGH (ref 0.3–1.2)
Total Protein: 5.3 g/dL — ABNORMAL LOW (ref 6.5–8.1)

## 2021-08-02 LAB — CBC WITH DIFFERENTIAL/PLATELET
Abs Immature Granulocytes: 0.04 10*3/uL (ref 0.00–0.07)
Basophils Absolute: 0.1 10*3/uL (ref 0.0–0.1)
Basophils Relative: 1 %
Eosinophils Absolute: 0.1 10*3/uL (ref 0.0–0.5)
Eosinophils Relative: 1 %
HCT: 30.5 % — ABNORMAL LOW (ref 39.0–52.0)
Hemoglobin: 10.1 g/dL — ABNORMAL LOW (ref 13.0–17.0)
Immature Granulocytes: 0 %
Lymphocytes Relative: 4 %
Lymphs Abs: 0.4 10*3/uL — ABNORMAL LOW (ref 0.7–4.0)
MCH: 30.3 pg (ref 26.0–34.0)
MCHC: 33.1 g/dL (ref 30.0–36.0)
MCV: 91.6 fL (ref 80.0–100.0)
Monocytes Absolute: 1 10*3/uL (ref 0.1–1.0)
Monocytes Relative: 10 %
Neutro Abs: 8.6 10*3/uL — ABNORMAL HIGH (ref 1.7–7.7)
Neutrophils Relative %: 84 %
Platelets: 151 10*3/uL (ref 150–400)
RBC: 3.33 MIL/uL — ABNORMAL LOW (ref 4.22–5.81)
RDW: 13.9 % (ref 11.5–15.5)
WBC: 10.1 10*3/uL (ref 4.0–10.5)
nRBC: 0 % (ref 0.0–0.2)

## 2021-08-02 LAB — HEMOGLOBIN AND HEMATOCRIT, BLOOD
HCT: 28.3 % — ABNORMAL LOW (ref 39.0–52.0)
HCT: 27 % — ABNORMAL LOW (ref 39.0–52.0)
Hemoglobin: 9.4 g/dL — ABNORMAL LOW (ref 13.0–17.0)
Hemoglobin: 9.1 g/dL — ABNORMAL LOW (ref 13.0–17.0)

## 2021-08-02 LAB — PHOSPHORUS: Phosphorus: 3.9 mg/dL (ref 2.5–4.6)

## 2021-08-02 LAB — MAGNESIUM: Magnesium: 2 mg/dL (ref 1.7–2.4)

## 2021-08-02 MED ORDER — POLYETHYLENE GLYCOL 3350 17 G PO PACK
17.0000 g | PACK | Freq: Every day | ORAL | Status: DC
  Administered 2021-08-06: 08:00:00 17 g via ORAL
  Filled 2021-08-02 (×3): qty 1

## 2021-08-02 MED ORDER — ACETAMINOPHEN 325 MG PO TABS
650.0000 mg | ORAL_TABLET | Freq: Four times a day (QID) | ORAL | Status: DC | PRN
  Administered 2021-08-05: 23:00:00 650 mg via ORAL
  Filled 2021-08-02: qty 2

## 2021-08-02 MED ORDER — OXYCODONE HCL 5 MG PO TABS
5.0000 mg | ORAL_TABLET | ORAL | Status: DC | PRN
  Administered 2021-08-02 – 2021-08-05 (×7): 5 mg via ORAL
  Filled 2021-08-02 (×8): qty 1

## 2021-08-02 MED ORDER — OXYCODONE HCL 5 MG PO TABS: 2.5000 mg | ORAL_TABLET | ORAL | Status: DC | PRN

## 2021-08-02 MED ORDER — ACETAMINOPHEN 500 MG PO TABS
1000.0000 mg | ORAL_TABLET | Freq: Three times a day (TID) | ORAL | Status: AC
  Administered 2021-08-02 – 2021-08-04 (×8): 1000 mg via ORAL
  Filled 2021-08-02 (×8): qty 2

## 2021-08-02 NOTE — Progress Notes (Signed)
[facility] Encounter Note  Patient: Jared Castro / Admit Date: 07/31/2021 / Date of Encounter: 08/02/2021, 7:12 AM   Subjective: Overall patient is done very well since surgical intervention from orthopedic injury.  No evidence of cardiovascular symptoms of chest pain or congestive heart failure previously on appropriate medication management.  Blood pressure slightly lower at this time.  Review of Systems: Positive for: Leg pain Negative for: Vision change, hearing change, syncope, dizziness, nausea, vomiting,diarrhea, bloody stool, stomach pain, cough, congestion, diaphoresis, urinary frequency, urinary pain,skin lesions, skin rashes Others previously listed  Objective:   Physical Exam: Blood pressure (!) 94/54, pulse 94, temperature 97.6 F (36.4 C), resp. rate 16, height 5\' 8"  (1.727 m), weight 72.7 kg, SpO2 90 %. Body mass index is 24.37 kg/m. General: Well developed, well nourished, in no acute distress. Head: Normocephalic, atraumatic, sclera non-icteric, no xanthomas, nares are without discharge. Neck: No apparent masses Lungs: Normal respirations with no wheezes, no rhonchi, no rales , no crackles   Heart: Regular rate and rhythm, normal S1 S2, no murmur, no rub, no gallop, PMI is normal size and placement, carotid upstroke normal without bruit, jugular venous pressure normal Abdomen: Soft, non-tender, non-distended with normoactive bowel sounds. No hepatosplenomegaly. Abdominal aorta is normal size without bruit Extremities: No edema, no clubbing, no cyanosis, no ulcers,  Peripheral: 2+ radial, 2+ femoral, 2+ dorsal pedal pulses Neuro: Alert and oriented. Moves all extremities spontaneously. Psych:  Responds to questions appropriately with a normal affect.   Intake/Output Summary (Last 24 hours) at 08/02/2021 8299 Last data filed at 08/02/2021 0548 Gross per 24 hour  Intake 1020 ml  Output 1050 ml  Net -30 ml    Inpatient Medications:    cholecalciferol  1,000 Units Oral Daily   docusate sodium  100 mg Oral BID   enoxaparin (LOVENOX) injection  40 mg Subcutaneous Q24H   magnesium hydroxide  30 mL Oral QHS   magnesium oxide  400 mg Oral BID   metoprolol succinate  25 mg Oral BID   mometasone-formoterol  2 puff Inhalation BID   multivitamin with minerals  1 tablet Oral Daily   pantoprazole  40 mg Oral Daily   Infusions:   sodium chloride 100 mL/hr at 08/02/21 0537   promethazine (PHENERGAN) injection (IM or IVPB)      Labs: Recent Labs    08/01/21 0545 08/01/21 2159 08/02/21 0403  NA 137  --  137  K 4.1  --  3.9  CL 105  --  106  CO2 22  --  24  GLUCOSE 122*  --  116*  BUN 22  --  22  CREATININE 1.07 1.02 1.06  CALCIUM 8.5*  --  7.8*  MG  --   --  2.0  PHOS  --   --  3.9   Recent Labs    08/02/21 0403  AST 25  ALT 19  ALKPHOS 48  BILITOT 1.4*  PROT 5.3*  ALBUMIN 2.9*   Recent Labs    07/31/21 1816 08/01/21 0545 08/01/21 2159 08/02/21 0403  WBC 10.2   < > 11.7* 10.1  NEUTROABS 8.6*  --   --  8.6*  HGB 12.3*   < > 11.0* 10.1*  HCT 37.3*   < > 33.0* 30.5*  MCV 92.3   < > 90.7 91.6  PLT 205   < > 153 151   < > = values in this interval not displayed.   No results for input(s): CKTOTAL, CKMB, TROPONINI  in the last 72 hours. Invalid input(s): POCBNP No results for input(s): HGBA1C in the last 72 hours.   Weights: Filed Weights   07/31/21 2059  Weight: 72.7 kg     Radiology/Studies:  DG Chest 1 View  Result Date: 07/31/2021 CLINICAL DATA:  Hip fracture EXAM: CHEST  1 VIEW COMPARISON:  None. FINDINGS: Left-sided pacing device. No focal opacity, pleural effusion or pneumothorax. Normal cardiac size. IMPRESSION: No active disease. Electronically Signed   By: Donavan Foil M.D.   On: 07/31/2021 18:05   DG C-Arm 1-60 Min-No Report  Result Date: 08/01/2021 Fluoroscopy was utilized by the requesting physician.  No radiographic interpretation.   DG HIP UNILAT WITH PELVIS 1V RIGHT  Result  Date: 08/01/2021 CLINICAL DATA:  Hip replacement EXAM: DG HIP (WITH OR WITHOUT PELVIS) 1V RIGHT COMPARISON:  07/31/2021 FINDINGS: Four low resolution intraoperative spot views of the right hip. The images were obtained during operative course of right hip replacement. There is normal alignment IMPRESSION: Fluoroscopic assistance provided during right hip replacement. Electronically Signed   By: Donavan Foil M.D.   On: 08/01/2021 21:19   DG HIP UNILAT W OR W/O PELVIS 2-3 VIEWS RIGHT  Result Date: 08/01/2021 CLINICAL DATA:  Postop pain. EXAM: DG HIP (WITH OR WITHOUT PELVIS) 2-3V RIGHT COMPARISON:  None. FINDINGS: The patient is status post right hip replacement. Hardware is in good position. Skin staples are noted. No other acute abnormalities are identified. IMPRESSION: Status post right hip replacement. Hardware is in good position. No cause for pain identified. Electronically Signed   By: Dorise Bullion III M.D.   On: 08/01/2021 22:19   DG Hip Unilat  With Pelvis 2-3 Views Right  Result Date: 07/31/2021 CLINICAL DATA:  Fall EXAM: DG HIP (WITH OR WITHOUT PELVIS) 2-3V RIGHT COMPARISON:  None. FINDINGS: Postsurgical changes over the pelvis. Multiple phleboliths. Pubic symphysis and rami appear intact. Acute right subcapital femoral neck fracture with cranial displacement of the trochanter. No femoral head dislocation. Probable right lower quadrant ostomy. IMPRESSION: Acute displaced right femoral neck fracture. Electronically Signed   By: Donavan Foil M.D.   On: 07/31/2021 18:05     Assessment and Recommendation  87 y.o. male with known nonischemic dilated cardiomyopathy status post pacemaker placement and defibrillator placement currently without evidence of congestive heart failure or anginal symptoms with orthopedic injury and successful surgery 1.  Continue rehabilitation without restriction at this time 2.  Continue metoprolol but no additional medication management due to lower blood pressure  at this time and follow-up for concerns for lower blood pressure.  It is okay for hydration if necessary 3.  No further cardiac diagnostics necessary at this time 4.  Call if further questions  Signed, Serafina Royals M.D. FACC

## 2021-08-02 NOTE — Progress Notes (Signed)
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    Assessment & Plan:   Principal Problem:   Right femoral fracture (HCC) Active Problems:   Nonischemic dilated cardiomyopathy (HCC)   Hypotension   ICD (implantable cardioverter-defibrillator) in place   Essential hypertension   HLD (hyperlipidemia)   Delirium   Chronic midline low back pain   Atrial flutter (HCC)    * Right femoral fracture (HCC) Mechanical fall, no head trauma S/p total hip arthroplasty anterior approach 12/16 Pain regimen, bowel regimen lovenox Pain meds, bowel regimen Ortho recommending therapy, likely SNF, WBAT  Hypotension BP soft this morning Losartan already on hold. Hold metoprolol for now Trend H/H Repeat echo Will follow closely   Nonischemic dilated cardiomyopathy ([place]) Follows with Dr. Ubaldo Glassing, [facility] EF 30-35%, unclear when this echo was done Appreciate cards recs Will repeat echo with hypotension   ICD (implantable cardioverter-defibrillator) in place noted  Essential hypertension Hold lostartan Hold metop, consider resumption later if improved  Delirium Mild and brief Delirium precautions, follow  HLD (hyperlipidemia) Doesn't appear to be on statin  Atrial flutter ([place]) History noted, doesn't appear to be on anticoagulation Per cards   DVT prophylaxis: heparin Code Status: full Family Communication: daughters at bedside Disposition:   Status is: Inpatient  Remains inpatient appropriate because: need for ortho        Consultants:  Cardiology orthopedics  Procedures:  none  Antimicrobials:   Anti-infectives (From admission, onward)    Start     Dose/Rate Route Frequency Ordered Stop   08/02/21 0915  ceFAZolin (ANCEF) IVPB 2g/100 mL premix        2 g 200 mL/hr over 30 Minutes Intravenous  Once 08/01/21 0744 08/01/21 1917   08/02/21 0000  ceFAZolin (ANCEF) IVPB 2g/100 mL premix        2 g 200 mL/hr over 30 Minutes Intravenous Every 6 hours 08/01/21 2143 08/02/21 0608   08/01/21 1553  ceFAZolin (ANCEF) 2-4 GM/100ML-% IVPB       Note to Pharmacy: Herby Abraham W: cabinet override      08/01/21 1553 08/01/21 1853       Subjective: Mildly confused, improves during conversation  Objective: Vitals:   08/01/21 2147 08/01/21 2336 08/02/21 0411 08/02/21 0754  BP: (!) 95/54 (!) 91/47 (!) 94/54 117/72  Pulse: 89 89 94 97  Resp: 18 18 16 18   Temp: 98.1 F (36.7 C)  97.6 F (36.4 C) 98 F (36.7 C)  TempSrc:      SpO2: 90% 91% 90% 91%  Weight:      Height:        Intake/Output Summary (Last 24 hours) at 08/02/2021 1451 Last data filed at 08/02/2021 1428 Gross per 24 hour  Intake 1020 ml  Output 1050 ml  Net -30 ml   Filed Weights   07/31/21 2059  Weight: 72.7 kg    Examination:  General: No acute distress. Cardiovascular: RRR Lungs: unlabored Abdomen: Soft, nontender, nondistended - R ostomy Neurological: Alert and oriented 3. Moves all extremities 4. Cranial nerves II through XII grossly intact. Extremities: RLE with intact dressing, wound vac    Data  Reviewed: I have personally reviewed following labs and imaging studies  CBC: Recent Labs  Lab 07/31/21 1816 08/01/21 0545 08/01/21 2159 08/02/21 0403 08/02/21 1119  WBC 10.2 8.9 11.7* 10.1  --   NEUTROABS 8.6*  --   --  8.6*  --   HGB 12.3* 11.7* 11.0* 10.1* 9.4*  HCT 37.3* 34.8* 33.0* 30.5* 28.3*  MCV 92.3 91.1 90.7 91.6  --   PLT 205 184 153 151  --     Basic Metabolic Panel: Recent Labs  Lab 07/31/21 1816 08/01/21 0545 08/01/21 2159 08/02/21 0403  NA 137 137  --  137  K 4.1 4.1   --  3.9  CL 105 105  --  106  CO2 22 22  --  24  GLUCOSE 109* 122*  --  116*  BUN 22 22  --  22  CREATININE 1.04 1.07 1.02 1.06  CALCIUM 8.9 8.5*  --  7.8*  MG  --   --   --  2.0  PHOS  --   --   --  3.9    GFR: Estimated Creatinine Clearance: 48.4 mL/min (by C-G formula based on SCr of 1.06 mg/dL).  Liver Function Tests: Recent Labs  Lab 08/02/21 0403  AST 25  ALT 19  ALKPHOS 48  BILITOT 1.4*  PROT 5.3*  ALBUMIN 2.9*    CBG: No results for input(s): GLUCAP in the last 168 hours.   Recent Results (from the past 240 hour(s))  Resp Panel by RT-PCR (Flu Joyell Emami&B, Covid) Nasopharyngeal Swab     Status: None   Collection Time: 07/31/21  6:17 PM   Specimen: Nasopharyngeal Swab; Nasopharyngeal(NP) swabs in vial transport medium  Result Value Ref Range Status   SARS Coronavirus 2 by RT PCR NEGATIVE NEGATIVE Final    Comment: (NOTE) SARS-CoV-2 target nucleic acids are NOT DETECTED.  The SARS-CoV-2 RNA is generally detectable in upper respiratory specimens during the acute phase of infection. The lowest concentration of SARS-CoV-2 viral copies this assay can detect is 138 copies/mL. Neriah Brott negative result does not preclude SARS-Cov-2 infection and should not be used as the sole basis for treatment or other patient management decisions. Kla Bily negative result may occur with  improper specimen collection/handling, submission of specimen other than nasopharyngeal swab, presence of viral mutation(s) within the areas targeted by this assay, and inadequate number of viral copies(<138 copies/mL). Ruweyda Macknight negative result must be combined with clinical observations, patient history, and epidemiological information. The expected result is Negative.  Fact Sheet for Patients:  EntrepreneurPulse.com.au  Fact Sheet for Healthcare Providers:  IncredibleEmployment.be  This test is no t yet approved or cleared by the [place] FDA and  has been authorized for  detection and/or diagnosis of SARS-CoV-2 by FDA under an Emergency Use Authorization (EUA). This EUA will remain  in effect (meaning this test can be used) for the duration of the COVID-19 declaration under Section 564(b)(1) of the Act, 21 U.S.C.section 360bbb-3(b)(1), unless the authorization is terminated  or revoked sooner.       Influenza Winna Golla by PCR NEGATIVE NEGATIVE Final   Influenza B by PCR NEGATIVE NEGATIVE Final    Comment: (NOTE) The Xpert Xpress SARS-CoV-2/FLU/RSV plus assay is intended as an aid in the diagnosis of influenza from Nasopharyngeal swab specimens and should not be used as Sherrel Ploch sole basis for treatment. Nasal washings and aspirates are unacceptable for Xpert Xpress SARS-CoV-2/FLU/RSV testing.  Fact Sheet for Patients: EntrepreneurPulse.com.au  Fact Sheet for Healthcare Providers: IncredibleEmployment.be  This test  is not yet approved or cleared by the [place] and has been authorized for detection and/or diagnosis of SARS-CoV-2 by FDA under an Emergency Use Authorization (EUA). This EUA will remain in effect (meaning this test can be used) for the duration of the COVID-19 declaration under Section 564(b)(1) of the Act, 21 U.S.C. section 360bbb-3(b)(1), unless the authorization is terminated or revoked.  Performed at [facility], [address], [place], [place] [address]          Radiology Studies: DG Chest 1 View  Result Date: 07/31/2021 CLINICAL DATA:  Hip fracture EXAM: CHEST  1 VIEW COMPARISON:  None. FINDINGS: Left-sided pacing device. No focal opacity, pleural effusion or pneumothorax. Normal cardiac size. IMPRESSION: No active disease. Electronically Signed   By: Donavan Foil M.D.   On: 07/31/2021 18:05   DG C-Arm 1-60 Min-No Report  Result Date: 08/01/2021 Fluoroscopy was utilized by the requesting physician.  No radiographic interpretation.   DG HIP UNILAT WITH PELVIS 1V  RIGHT  Result Date: 08/01/2021 CLINICAL DATA:  Hip replacement EXAM: DG HIP (WITH OR WITHOUT PELVIS) 1V RIGHT COMPARISON:  07/31/2021 FINDINGS: Four low resolution intraoperative spot views of the right hip. The images were obtained during operative course of right hip replacement. There is normal alignment IMPRESSION: Fluoroscopic assistance provided during right hip replacement. Electronically Signed   By: Donavan Foil M.D.   On: 08/01/2021 21:19   DG HIP UNILAT W OR W/O PELVIS 2-3 VIEWS RIGHT  Result Date: 08/01/2021 CLINICAL DATA:  Postop pain. EXAM: DG HIP (WITH OR WITHOUT PELVIS) 2-3V RIGHT COMPARISON:  None. FINDINGS: The patient is status post right hip replacement. Hardware is in good position. Skin staples are noted. No other acute abnormalities are identified. IMPRESSION: Status post right hip replacement. Hardware is in good position. No cause for pain identified. Electronically Signed   By: Dorise Bullion III M.D.   On: 08/01/2021 22:19   DG Hip Unilat  With Pelvis 2-3 Views Right  Result Date: 07/31/2021 CLINICAL DATA:  Fall EXAM: DG HIP (WITH OR WITHOUT PELVIS) 2-3V RIGHT COMPARISON:  None. FINDINGS: Postsurgical changes over the pelvis. Multiple phleboliths. Pubic symphysis and rami appear intact. Acute right subcapital femoral neck fracture with cranial displacement of the trochanter. No femoral head dislocation. Probable right lower quadrant ostomy. IMPRESSION: Acute displaced right femoral neck fracture. Electronically Signed   By: Donavan Foil M.D.   On: 07/31/2021 18:05        Scheduled Meds:  acetaminophen  1,000 mg Oral Q8H   cholecalciferol  1,000 Units Oral Daily   docusate sodium  100 mg Oral BID   enoxaparin (LOVENOX) injection  40 mg Subcutaneous Q24H   magnesium hydroxide  30 mL Oral QHS   magnesium oxide  400 mg Oral BID   mometasone-formoterol  2 puff Inhalation BID   multivitamin with minerals  1 tablet Oral Daily   pantoprazole  40 mg Oral Daily    polyethylene glycol  17 g Oral Daily   Continuous Infusions:  sodium chloride 100 mL/hr at 08/02/21 0537   promethazine (PHENERGAN) injection (IM or IVPB)       LOS: 2 days    Time spent: over 30 min    Fayrene Helper, MD [facility]   To contact the attending provider between 7A-7P or the covering provider during after hours 7P-7A, please log into the web site [email or web address] and access using universal [place] password for that web site. If you do not have  the password, please call the hospital operator.  08/02/2021, 2:51 PM

## 2021-08-02 NOTE — TOC Progression Note (Signed)
Transition of Care [facility]) - Progression Note    Patient Details  Name: Jared Castro MRN: [id number] Date of Birth: 1933/01/31  Transition of Care [facility]) CM/SW [place], RN Phone Number: 08/02/2021, 3:41 PM  Clinical Narrative:    Met with the patient and his wife in the room at the bedside They are agreeable to go to STR, SNF, they are residents at [facility] and will needs  a STR bed there, I notified Lawerance Sabal, PASSR obtained [phone] A, Fl2 completed,         Expected Discharge Plan and Services                                                 Social Determinants of Health (SDOH) Interventions    Readmission Risk Interventions No flowsheet data found.

## 2021-08-02 NOTE — Assessment & Plan Note (Addendum)
Mild and brief Delirium precautions, follow

## 2021-08-02 NOTE — Progress Notes (Signed)
Physical Therapy Treatment Patient Details Name: Jared Castro MRN: [id number] DOB: 07-19-1933 Today's Date: 08/02/2021   History of Present Illness Pt is an 87 y/o M admitted on 07/31/21 with c/c of fall & R hip pain. Imaging found acute displaced R femoral neck fx & pt underwent THA, anterior approach, by Dr. Rudene Christians on 08/01/21. PMH: HTN, paroxysmal SVT, nonischemic dilated cardiomyopathy, a-flutter, biventricular ICD in place, CAD, HLD, ileostomy    PT Comments    Pt seen for PT tx with wife present for session. Pt c/o feeling slightly "woozy" throughout session but it doesn't worsen, pt also c/o fatigue/being sleepy. Pt is able to progress to performing supine>sit with supervision, HOB elevated, bed rails & extra time. Pt requires mod<>max assist for sit>stand on this date, with ongoing cuing for safe hand placement. Pt does progress to ambulating 4 ft forwards + backwards in room with RW & min/mod assist. BP slightly improved but still on the lower side. Will continue to follow pt acutely to progress mobility as able.  BP in LUE: Supine: 118/45 mmHg (MAP 63) Sitting in recliner: 104/46 mmHg (MAP 66) After ambulating in room, sitting in recliner: 104/50 mmHg (MAP 65)    Recommendations for follow up therapy are one component of a multi-disciplinary discharge planning process, led by the attending physician.  Recommendations may be updated based on patient status, additional functional criteria and insurance authorization.  Follow Up Recommendations  Skilled nursing-short term rehab (<3 hours/day)     Assistance Recommended at Discharge Frequent or constant Supervision/Assistance  Equipment Recommendations  Rolling walker (2 wheels);BSC/3in1    Recommendations for Other Services       Precautions / Restrictions Precautions Precautions: Fall Restrictions Weight Bearing Restrictions: Yes RLE Weight Bearing: Weight bearing as tolerated     Mobility  Bed Mobility Overal bed  mobility: Needs Assistance Bed Mobility: Supine to Sit     Supine to sit: Supervision;HOB elevated Sit to supine: Min assist   General bed mobility comments: extra time but able to transfer supine>sit with supervision & cuing to use bed rails    Transfers Overall transfer level: Needs assistance Equipment used: Rolling walker (2 wheels) Transfers: Sit to/from Stand;Bed to chair/wheelchair/BSC Sit to Stand:  (max assist from elevated soft bed, mod assist from recliner with B armrests) Stand pivot transfers: Mod assist   Step pivot transfers: Mod assist          Ambulation/Gait Ambulation/Gait assistance: Min assist;Mod assist Gait Distance (Feet): 4 Feet (4 ft forwards + 4 ft backwards) Assistive device: Rolling walker (2 wheels) Gait Pattern/deviations: Decreased step length - right;Decreased step length - left;Decreased stride length;Decreased dorsiflexion - right;Decreased dorsiflexion - left;Decreased weight shift to right Gait velocity: decreased         Stairs             Wheelchair Mobility    Modified Rankin (Stroke Patients Only)       Balance Overall balance assessment: Needs assistance Sitting-balance support: No upper extremity supported;Feet supported Sitting balance-Leahy Scale: Fair     Standing balance support: Bilateral upper extremity supported;During functional activity Standing balance-Leahy Scale: Poor                              Cognition Arousal/Alertness: Awake/alert Behavior During Therapy: WFL for tasks assessed/performed Overall Cognitive Status: Within Functional Limits for tasks assessed  General Comments: c/o feeling fatigued/sleepy during session        Exercises Total Joint Exercises Ankle Circles/Pumps: AROM;Both;10 reps;Supine Quad Sets: AROM;Strengthening;Right;10 reps;Supine Gluteal Sets: AROM;Strengthening;10 reps;Supine Short Arc Quad:  AROM;Strengthening;Right;10 reps;Supine Heel Slides: AAROM;Strengthening;Right;10 reps;Supine Hip ABduction/ADduction: AAROM;Strengthening;Right;10 reps;Supine (hip adduction pillow squeezes x 10, hip abduction slides AAROM x 10) Straight Leg Raises: AAROM;Strengthening;Right;10 reps;Supine Long Arc Quad: AAROM;Strengthening;Right;Seated;20 reps Other Exercises Other Exercises: Pt educated re; OT role, DME recs, d/c recs    General Comments General comments (skin integrity, edema, etc.): discussed d/c recommendations with pt & wife      Pertinent Vitals/Pain Pain Assessment: Faces Pain Score: 2  Faces Pain Scale: Hurts a little bit Pain Location: R hip Pain Descriptors / Indicators: Aching;Discomfort Pain Intervention(s): Limited activity within patient's tolerance;Monitored during session (pt has requested pain meds from nurse)    Home Living Family/patient expects to be discharged to:: Private residence Living Arrangements: Spouse/significant other Available Help at Discharge: Family;Available 24 hours/day Type of Home: House Home Access: Level entry       Home Layout: Able to live on main level with bedroom/bathroom Home Equipment: Rolling Walker (2 wheels)      Prior Function            PT Goals (current goals can now be found in the care plan section) Acute Rehab PT Goals Patient Stated Goal: return to PLOF PT Goal Formulation: With patient Time For Goal Achievement: 08/16/21 Potential to Achieve Goals: Good Progress towards PT goals: Progressing toward goals    Frequency    BID      PT Plan Current plan remains appropriate    Co-evaluation              AM-PAC PT "6 Clicks" Mobility   Outcome Measure  Help needed turning from your back to your side while in a flat bed without using bedrails?: A Little Help needed moving from lying on your back to sitting on the side of a flat bed without using bedrails?: A Little Help needed moving to and from  a bed to a chair (including a wheelchair)?: A Lot Help needed standing up from a chair using your arms (e.g., wheelchair or bedside chair)?: A Lot Help needed to walk in hospital room?: A Lot Help needed climbing 3-5 steps with a railing? : Total 6 Click Score: 13    End of Session Equipment Utilized During Treatment: Gait belt Activity Tolerance: Patient limited by fatigue;Patient tolerated treatment well Patient left: in chair;with chair alarm set;with call bell/phone within reach;with family/visitor present Nurse Communication: Mobility status (BP) PT Visit Diagnosis: Unsteadiness on feet (R26.81);Muscle weakness (generalized) (M62.81);Difficulty in walking, not elsewhere classified (R26.2);Pain Pain - Right/Left: Right Pain - part of body: Hip     Time: 7106-2694 PT Time Calculation (min) (ACUTE ONLY): 23 min  Charges:  $Therapeutic Exercise: 8-22 mins $Therapeutic Activity: 23-37 mins                     Lavone Nian, PT, DPT 08/02/21, 3:21 PM    Waunita Schooner 08/02/2021, 3:19 PM

## 2021-08-02 NOTE — NC FL2 (Signed)
Center LEVEL OF CARE SCREENING TOOL     IDENTIFICATION  Patient Name: Jared Castro Birthdate: [birth year] Sex: male Admission Date (Current Location): 07/31/2021  [facility] and [place] Number:  Engineering geologist and Address:  [facility], [address] [place], [place] [address]      Provider Number:    Attending Physician Name and Address:  Elodia Florence., *  Relative Name and Phone Number:  Marcie Bal, wife [phone]    Current Level of Care: Hospital Recommended Level of Care: [place] Prior Approval Number:    Date Approved/Denied:   PASRR Number:    Discharge Plan: SNF    Current Diagnoses: Patient Active Problem List   Diagnosis Date Noted   Hypotension 08/02/2021   Delirium 08/02/2021   Atrial flutter ([place]) 08/01/2021   Right femoral fracture ([place]) 07/31/2021   Essential hypertension 07/31/2021   ICD (implantable cardioverter-defibrillator) in place 07/31/2021   Nonischemic dilated cardiomyopathy ([place]) 07/31/2021   HLD (hyperlipidemia) 07/31/2021   Chronic midline low back pain 07/31/2021    Orientation RESPIRATION BLADDER Height & Weight     Self, Time, Situation, Place  Normal Continent Weight: 72.7 kg Height:  5\' 8"  (172.7 cm)  BEHAVIORAL SYMPTOMS/MOOD NEUROLOGICAL BOWEL NUTRITION STATUS      Continent Diet (regular)  AMBULATORY STATUS COMMUNICATION OF NEEDS Skin   Extensive Assist Verbally Normal, Surgical wounds                       Personal Care Assistance Level of Assistance  Bathing, Feeding, Dressing Bathing Assistance: Limited assistance Feeding assistance: Independent       Functional Limitations Info             SPECIAL CARE FACTORS FREQUENCY  PT (By licensed PT), OT (By licensed OT)     PT Frequency: 5 times per week OT Frequency: 5 times per week            Contractures Contractures Info: Not present    Additional Factors Info  Code  Status, Allergies Code Status Info: Full code Allergies Info: NKDA           Current Medications (08/02/2021):  This is the current hospital active medication list Current Facility-Administered Medications  Medication Dose Route Frequency Provider Last Rate Last Admin   0.9 %  sodium chloride infusion   Intravenous Continuous Hessie Knows, MD 100 mL/hr at 08/02/21 0537 New Bag at 08/02/21 0537   acetaminophen (TYLENOL) tablet 1,000 mg  1,000 mg Oral Q8H Elodia Florence., MD   1,000 mg at 08/02/21 3474   Followed by   Derrill Memo ON 08/05/2021] acetaminophen (TYLENOL) tablet 650 mg  650 mg Oral Q6H PRN Elodia Florence., MD       albuterol (PROVENTIL) (2.5 MG/3ML) 0.083% nebulizer solution 3 mL  3 mL Nebulization Q6H PRN Hessie Knows, MD       alum & mag hydroxide-simeth (MAALOX/MYLANTA) 200-200-20 MG/5ML suspension 30 mL  30 mL Oral Q4H PRN Hessie Knows, MD       bisacodyl (DULCOLAX) suppository 10 mg  10 mg Rectal Daily PRN Hessie Knows, MD       cholecalciferol (VITAMIN D3) tablet 1,000 Units  1,000 Units Oral Daily Hessie Knows, MD   1,000 Units at 08/02/21 2595   diphenhydrAMINE (BENADRYL) 12.5 MG/5ML elixir 12.5-25 mg  12.5-25 mg Oral Q4H PRN Hessie Knows, MD       docusate sodium (COLACE) capsule  100 mg  100 mg Oral BID Hessie Knows, MD   100 mg at 08/02/21 0807   enoxaparin (LOVENOX) injection 40 mg  40 mg Subcutaneous Q24H Hessie Knows, MD   40 mg at 08/02/21 6073   magnesium hydroxide (MILK OF MAGNESIA) suspension 30 mL  30 mL Oral QHS Hessie Knows, MD       magnesium oxide (MAG-OX) tablet 400 mg  400 mg Oral BID Hessie Knows, MD   400 mg at 08/02/21 7106   menthol-cetylpyridinium (CEPACOL) lozenge 3 mg  1 lozenge Oral PRN Hessie Knows, MD       Or   phenol (CHLORASEPTIC) mouth spray 1 spray  1 spray Mouth/Throat PRN Hessie Knows, MD       mometasone-formoterol [facility]) 200-5 MCG/ACT inhaler 2 puff  2 puff Inhalation BID Hessie Knows, MD   2 puff at 08/01/21 0849    morphine 2 MG/ML injection 0.5 mg  0.5 mg Intravenous Q2H PRN Hessie Knows, MD   0.5 mg at 08/01/21 1210   multivitamin with minerals tablet 1 tablet  1 tablet Oral Daily Hessie Knows, MD   1 tablet at 08/02/21 0807   ondansetron (ZOFRAN) tablet 4 mg  4 mg Oral Q6H PRN Hessie Knows, MD       Or   ondansetron [facility]) injection 4 mg  4 mg Intravenous Q6H PRN Hessie Knows, MD   4 mg at 08/01/21 2213   oxyCODONE (Oxy IR/ROXICODONE) immediate release tablet 2.5 mg  2.5 mg Oral Q4H PRN Elodia Florence., MD       Or   oxyCODONE (Oxy IR/ROXICODONE) immediate release tablet 5 mg  5 mg Oral Q4H PRN Elodia Florence., MD   5 mg at 08/02/21 1520   pantoprazole (PROTONIX) EC tablet 40 mg  40 mg Oral Daily Hessie Knows, MD   40 mg at 08/02/21 0807   polyethylene glycol (MIRALAX / GLYCOLAX) packet 17 g  17 g Oral BID PRN Hessie Knows, MD   17 g at 08/02/21 0806   polyethylene glycol (MIRALAX / GLYCOLAX) packet 17 g  17 g Oral Daily Elodia Florence., MD       promethazine (PHENERGAN) 6.25 mg in sodium chloride 0.9 % 50 mL IVPB  6.25 mg Intravenous Q6H PRN Hessie Knows, MD         Discharge Medications: Please see discharge summary for a list of discharge medications.  Relevant Imaging Results:  Relevant Lab Results:   Additional Information [id number]  Conception Oms, RN

## 2021-08-02 NOTE — Progress Notes (Signed)
°  Subjective: 1 Day Post-Op Procedure(s) (LRB): TOTAL HIP ARTHROPLASTY ANTERIOR APPROACH (Right) Patient reports pain as moderate.   Prior to his fall patient had an appointment with [facility] for cervical spondylosis. Patient is  well but does report moderate pain. Unable to give pain meds last night due to hypotension. PT and care management to assist with discharge planning. Negative for chest pain and shortness of breath Fever: no Gastrointestinal:Negative for nausea and vomiting  Objective: Vital signs in last 24 hours: Temp:  [97.6 F (36.4 C)-98.7 F (37.1 C)] 98 F (36.7 C) (12/17 0754) Pulse Rate:  [80-97] 97 (12/17 0754) Resp:  [12-19] 18 (12/17 0754) BP: (91-129)/(47-72) 117/72 (12/17 0754) SpO2:  [90 %-97 %] 91 % (12/17 0754)  Intake/Output from previous day:  Intake/Output Summary (Last 24 hours) at 08/02/2021 0815 Last data filed at 08/02/2021 0548 Gross per 24 hour  Intake 1020 ml  Output 1050 ml  Net -30 ml    Intake/Output this shift: No intake/output data recorded.  Labs: Recent Labs    07/31/21 1816 08/01/21 0545 08/01/21 2159 08/02/21 0403  HGB 12.3* 11.7* 11.0* 10.1*   Recent Labs    08/01/21 2159 08/02/21 0403  WBC 11.7* 10.1  RBC 3.64* 3.33*  HCT 33.0* 30.5*  PLT 153 151   Recent Labs    08/01/21 0545 08/01/21 2159 08/02/21 0403  NA 137  --  137  K 4.1  --  3.9  CL 105  --  106  CO2 22  --  24  BUN 22  --  22  CREATININE 1.07 1.02 1.06  GLUCOSE 122*  --  116*  CALCIUM 8.5*  --  7.8*   Recent Labs    07/31/21 1816  INR 1.1     EXAM General - Patient is Alert, Appropriate, and Oriented Extremity - ABD soft Neurovascular intact Incision: woundvac intact with good seal. No cellulitis present Dressing/Incision - Woundvac without any significant drainage at this time. Motor Function - intact, moving foot and toes well on exam.  Abdomen soft with normal bowel sounds.  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   Weight as of this encounter: 72.7 kg. Advance diet Up with therapy D/C IV fluids when tolerating po intake.  Labs reviewed this AM. WBC 10.1, Hg 10.1. Up with therapy today.  Resume pain meds when patient's BP improves. Patient may need SNF for period of time following discharge. Ileostomy bag in place.  DVT Prophylaxis - Lovenox and TED hose Weight-Bearing as tolerated to right leg  J. Cameron Proud, PA-C [facility] 08/02/2021, 8:15 AM

## 2021-08-02 NOTE — Assessment & Plan Note (Addendum)
BP's remain improved Losartan already on hold. Hold metoprolol for now Hb trending down, transfuse today 12/20 and follow Repeat echo - EF 35-67%, grade 1 diastolic dysfunction Will follow closely

## 2021-08-02 NOTE — Plan of Care (Signed)
°  Problem: Education: Goal: Verbalization of understanding the information provided (i.e., activity precautions, restrictions, etc) will improve Outcome: Progressing Goal: Individualized Educational Video(s) Outcome: Progressing   Problem: Activity: Goal: Ability to ambulate and perform ADLs will improve Outcome: Progressing   Problem: Clinical Measurements: Goal: Postoperative complications will be avoided or minimized Outcome: Progressing   Problem: Self-Concept: Goal: Ability to maintain and perform role responsibilities to the fullest extent possible will improve Outcome: Progressing   Problem: Pain Management: Goal: Pain level will decrease Outcome: Progressing   Problem: Education: Goal: Knowledge of General Education information will improve Description: Including pain rating scale, medication(s)/side effects and non-pharmacologic comfort measures Outcome: Progressing   Problem: Health Behavior/Discharge Planning: Goal: Ability to manage health-related needs will improve Outcome: Progressing   Problem: Clinical Measurements: Goal: Ability to maintain clinical measurements within normal limits will improve Outcome: Progressing Goal: Will remain free from infection Outcome: Progressing Goal: Diagnostic test results will improve Outcome: Progressing Goal: Respiratory complications will improve Outcome: Progressing Goal: Cardiovascular complication will be avoided Outcome: Progressing   Problem: Activity: Goal: Risk for activity intolerance will decrease Outcome: Progressing   Problem: Nutrition: Goal: Adequate nutrition will be maintained Outcome: Progressing   Problem: Coping: Goal: Level of anxiety will decrease Outcome: Progressing   Problem: Elimination: Goal: Will not experience complications related to bowel motility Outcome: Progressing Goal: Will not experience complications related to urinary retention Outcome: Progressing   Problem: Pain  Managment: Goal: General experience of comfort will improve Outcome: Progressing   Problem: Safety: Goal: Ability to remain free from injury will improve Outcome: Progressing   Problem: Skin Integrity: Goal: Risk for impaired skin integrity will decrease Outcome: Progressing   Problem: Education: Goal: Knowledge of the prescribed therapeutic regimen will improve Outcome: Progressing Goal: Understanding of discharge needs will improve Outcome: Progressing Goal: Individualized Educational Video(s) Outcome: Progressing   Problem: Activity: Goal: Ability to avoid complications of mobility impairment will improve Outcome: Progressing Goal: Ability to tolerate increased activity will improve Outcome: Progressing   Problem: Clinical Measurements: Goal: Postoperative complications will be avoided or minimized Outcome: Progressing   Problem: Pain Management: Goal: Pain level will decrease with appropriate interventions Outcome: Progressing   Problem: Skin Integrity: Goal: Will show signs of wound healing Outcome: Progressing   

## 2021-08-02 NOTE — Evaluation (Signed)
Occupational Therapy Evaluation Patient Details Name: Jared Castro MRN: [id number] DOB: 1933/02/15 Today's Date: 08/02/2021   History of Present Illness Jared Castro is a 87 y.o. male with medical history significant for hypertension, history of paroxysmal SVT, nonischemic dilated cardiomyopathy, typical atrial flutter, biventricular ICD in place, CAD, history of hyperlipidemia, ileostomy in place with diagnosis of ulcerative colitis, chronic midline low back pain without sciatica, who presents to the emergency department from [facility] for chief concerns of a fall while doing laundry. Found to have  Right displaced femoral neck fracture with osteoarthritis now s/p R THA on 08/01/21   Clinical Impression   [name] was seen for OT evaluation this date. Prior to hospital admission, pt was Independent for mobility and ADLs. Pt lives with spouse at [place]. Pt presents to acute OT demonstrating impaired ADL performance and functional mobility 2/2 decreased activity tolerance and functional strength/ROM/balance deficits. Pt currently requires MAX A don B socks at bed level. MOD A for ADL t/f - limited by low BP (MD/RN notified). Pt would benefit from skilled OT to address noted impairments and functional limitations (see below for any additional details) in order to maximize safety and independence while minimizing falls risk and caregiver burden. Upon hospital discharge, recommend STR to maximize pt safety and return to PLOF - pt may progress to [place].   Reclined in chair: BP 81/40, MAP 48 Seated following t/f: BP97/50, MAP 67   Recommendations for follow up therapy are one component of a multi-disciplinary discharge planning process, led by the attending physician.  Recommendations may be updated based on patient status, additional functional criteria and insurance authorization.   Follow Up Recommendations  Skilled nursing-short term rehab (<3 hours/day) (may progress)    Assistance  Recommended at Discharge Intermittent Supervision/Assistance  Functional Status Assessment  Patient has had a recent decline in their functional status and demonstrates the ability to make significant improvements in function in a reasonable and predictable amount of time.  Equipment Recommendations  BSC/3in1    Recommendations for Other Services       Precautions / Restrictions Precautions Precautions: Fall Restrictions Weight Bearing Restrictions: Yes RLE Weight Bearing: Weight bearing as tolerated      Mobility Bed Mobility Overal bed mobility: Needs Assistance Bed Mobility: Sit to Supine       Sit to supine: Min assist        Transfers Overall transfer level: Needs assistance Equipment used: None Transfers: Sit to/from Stand;Bed to chair/wheelchair/BSC Sit to Stand: Mod assist Stand pivot transfers: Mod assist                Balance Overall balance assessment: Needs assistance Sitting-balance support: No upper extremity supported;Feet supported Sitting balance-Leahy Scale: Fair     Standing balance support: Bilateral upper extremity supported Standing balance-Leahy Scale: Poor                             ADL either performed or assessed with clinical judgement   ADL Overall ADL's : Needs assistance/impaired                                       General ADL Comments: MAX A don B socks at bed level. MOD A for ADL t/f - limited by low BP      Pertinent Vitals/Pain Pain Assessment: 0-10 Pain Score: 4  Pain Location: R hip Pain Descriptors / Indicators: Aching;Dull Pain Intervention(s): Limited activity within patient's tolerance     Hand Dominance     Extremity/Trunk Assessment Upper Extremity Assessment Upper Extremity Assessment: Generalized weakness   Lower Extremity Assessment Lower Extremity Assessment: Generalized weakness       Communication Communication Communication: No difficulties   Cognition  Arousal/Alertness: Awake/alert Behavior During Therapy: WFL for tasks assessed/performed Overall Cognitive Status: Within Functional Limits for tasks assessed                                       General Comments  Supine: BP 81/40 MAP 48. Sitting97/50 (67)    Exercises Exercises: Other exercises Other Exercises Other Exercises: Pt educated re; OT role, DME recs, d/c recs   Shoulder Instructions      Home Living Family/patient expects to be discharged to:: Private residence Living Arrangements: Spouse/significant other Available Help at Discharge: Family;Available 24 hours/day Type of Home: House Home Access: Level entry     Home Layout: Able to live on main level with bedroom/bathroom     Bathroom Shower/Tub: Walk-in shower         Home Equipment: Conservation officer, nature (2 wheels)          Prior Functioning/Environment Prior Level of Function : Independent/Modified Independent             Mobility Comments: walks ~1 mile daily          OT Problem List: Decreased strength;Decreased range of motion;Decreased activity tolerance;Impaired balance (sitting and/or standing);Decreased safety awareness      OT Treatment/Interventions: Self-care/ADL training;Therapeutic exercise;Energy conservation;DME and/or AE instruction;Therapeutic activities;Patient/family education;Balance training    OT Goals(Current goals can be found in the care plan section) Acute Rehab OT Goals Patient Stated Goal: to feel better and go home\ OT Goal Formulation: With patient Time For Goal Achievement: 08/16/21 Potential to Achieve Goals: Good ADL Goals Pt Will Perform Grooming: with min assist;standing (c LRAD PRN) Pt Will Perform Lower Body Dressing: with min assist;sit to/from stand (c LRAD PRN) Pt Will Transfer to Toilet: with min guard assist;ambulating;regular height toilet (c LRAD PRN)  OT Frequency: Min 2X/week   Barriers to D/C:            Co-evaluation               AM-PAC OT "6 Clicks" Daily Activity     Outcome Measure Help from another person eating meals?: None Help from another person taking care of personal grooming?: A Little Help from another person toileting, which includes using toliet, bedpan, or urinal?: A Lot Help from another person bathing (including washing, rinsing, drying)?: A Lot Help from another person to put on and taking off regular upper body clothing?: A Little Help from another person to put on and taking off regular lower body clothing?: A Lot 6 Click Score: 16   End of Session Nurse Communication: Mobility status  Activity Tolerance: Patient tolerated treatment well Patient left: in bed;with call bell/phone within reach;with bed alarm set  OT Visit Diagnosis: Other abnormalities of gait and mobility (R26.89)                Time: 0254-2706 OT Time Calculation (min): 14 min Charges:  OT General Charges $OT Visit: 1 Visit OT Evaluation $OT Eval Low Complexity: 1 Low  Dessie Coma, M.S. OTR/L  08/02/21, 1:54 PM  ascom [phone]

## 2021-08-02 NOTE — Evaluation (Signed)
Physical Therapy Evaluation Patient Details Name: Jared Castro MRN: [id number] DOB: 10-23-1932 Today's Date: 08/02/2021  History of Present Illness  Pt is an 87 y/o M admitted on 07/31/21 with c/c of fall & R hip pain. Imaging found acute displaced R femoral neck fx & pt underwent THA, anterior approach, by Dr. Rudene Christians on 08/01/21. PMH: HTN, paroxysmal SVT, nonischemic dilated cardiomyopathy, a-flutter, biventricular ICD in place, CAD, HLD, ileostomy  Clinical Impression  Pt seen for PT evaluation with pt agreeable to tx. Pt c/o feeling "woozy" but attributes this to pain medication & fatigue 2/2 not sleeping a lot last night. Provided pt with HEP & pt performs RLE strengthening exercises with cuing for technique. Pt requires min/mod assist for supine>sit & mod assist for sit>stand & stand pivot bed>recliner with RW. Pt requires prolonged rest break between positional changes to allow increase in BP. Notified nurse & MD of pt's BP during session. Will continue to follow pt acutely to progress mobility as able. Currently recommending STR but if pt progresses well, may upgrade this to Green.  BP checked in LUE: Supine: 107/53 mmHg MAP 62 Initially sitting EOB: 87/50 mmHg MAP 63 After prolonged sitting EOB: 94/55 mmHg MAP 67 After transferring to recliner: 81/48 mmHg MAP 59 Reclined in chair with BLE elevated: 99/53 mmHg MAP 64 After prolonged sitting in recliner: 98/54 mmHg MAP 66     Recommendations for follow up therapy are one component of a multi-disciplinary discharge planning process, led by the attending physician.  Recommendations may be updated based on patient status, additional functional criteria and insurance authorization.  Follow Up Recommendations Skilled nursing-short term rehab (<3 hours/day)    Assistance Recommended at Discharge Frequent or constant Supervision/Assistance  Functional Status Assessment Patient has had a recent decline in their functional status and demonstrates  the ability to make significant improvements in function in a reasonable and predictable amount of time.  Equipment Recommendations  Rolling walker (2 wheels);BSC/3in1    Recommendations for Other Services       Precautions / Restrictions Precautions Precautions: Fall Restrictions Weight Bearing Restrictions: Yes RLE Weight Bearing: Weight bearing as tolerated      Mobility  Bed Mobility Overal bed mobility: Needs Assistance Bed Mobility: Supine to Sit     Supine to sit: Min assist;Mod assist Sit to supine: Min assist        Transfers Overall transfer level: Needs assistance Equipment used: Rolling walker (2 wheels) Transfers: Sit to/from Stand;Bed to chair/wheelchair/BSC Sit to Stand: Mod assist (instructional cuing for safe hand placement) Stand pivot transfers: Mod assist Step pivot transfers: Mod assist (cuing for stepping to recliner)            Ambulation/Gait                  Stairs            Wheelchair Mobility    Modified Rankin (Stroke Patients Only)       Balance Overall balance assessment: Needs assistance Sitting-balance support: No upper extremity supported;Feet supported Sitting balance-Leahy Scale: Fair     Standing balance support: Bilateral upper extremity supported;During functional activity Standing balance-Leahy Scale: Poor                               Pertinent Vitals/Pain Pain Assessment: 0-10 Pain Score: 2  Pain Location: R hip Pain Descriptors / Indicators: Aching;Discomfort Pain Intervention(s): Limited activity within patient's tolerance;Monitored during session;Premedicated before session  Home Living Family/patient expects to be discharged to:: Private residence Living Arrangements: Spouse/significant other Available Help at Discharge: Family;Available 24 hours/day Type of Home: House Home Access: Level entry       Home Layout: Able to live on main level with bedroom/bathroom Home  Equipment: Rolling Walker (2 wheels)      Prior Function Prior Level of Function : Independent/Modified Independent             Mobility Comments: Independent without AD, walks 1 mile 1-2x/day, only this 1 fall       Hand Dominance        Extremity/Trunk Assessment   Upper Extremity Assessment Upper Extremity Assessment: Generalized weakness    Lower Extremity Assessment Lower Extremity Assessment: Generalized weakness (2/5 knee extension in sitting)       Communication   Communication: No difficulties  Cognition Arousal/Alertness: Awake/alert Behavior During Therapy: WFL for tasks assessed/performed Overall Cognitive Status: Within Functional Limits for tasks assessed                                          General Comments General comments (skin integrity, edema, etc.): Supine: BP 81/40 MAP 48. Sitting97/50 (67)    Exercises Total Joint Exercises Ankle Circles/Pumps: AROM;Both;10 reps;Supine Quad Sets: AROM;Strengthening;Right;10 reps;Supine Gluteal Sets: AROM;Strengthening;10 reps;Supine Short Arc Quad: AROM;Strengthening;Right;10 reps;Supine Heel Slides: AAROM;Strengthening;Right;10 reps;Supine Hip ABduction/ADduction: AAROM;Strengthening;Right;10 reps;Supine (hip adduction pillow squeezes x 10, hip abduction slides AAROM x 10) Straight Leg Raises: AAROM;Strengthening;Right;10 reps;Supine Long Arc Quad: AAROM;Strengthening;Right;Seated;20 reps Other Exercises Other Exercises: Pt educated re; OT role, DME recs, d/c recs   Assessment/Plan    PT Assessment Patient needs continued PT services  PT Problem List Decreased strength;Decreased mobility;Decreased safety awareness;Decreased balance;Decreased knowledge of use of DME;Pain;Decreased activity tolerance;Cardiopulmonary status limiting activity;Decreased knowledge of precautions;Decreased range of motion       PT Treatment Interventions DME instruction;Therapeutic exercise;Balance  training;Gait training;Neuromuscular re-education;Modalities;Manual techniques;Functional mobility training;Therapeutic activities;Patient/family education    PT Goals (Current goals can be found in the Care Plan section)  Acute Rehab PT Goals Patient Stated Goal: return to PLOF PT Goal Formulation: With patient Time For Goal Achievement: 08/16/21 Potential to Achieve Goals: Good    Frequency BID   Barriers to discharge Decreased caregiver support      Co-evaluation               AM-PAC PT "6 Clicks" Mobility  Outcome Measure Help needed turning from your back to your side while in a flat bed without using bedrails?: A Little Help needed moving from lying on your back to sitting on the side of a flat bed without using bedrails?: A Lot Help needed moving to and from a bed to a chair (including a wheelchair)?: A Lot Help needed standing up from a chair using your arms (e.g., wheelchair or bedside chair)?: A Lot Help needed to walk in hospital room?: A Lot Help needed climbing 3-5 steps with a railing? : Total 6 Click Score: 12    End of Session Equipment Utilized During Treatment: Gait belt Activity Tolerance: Patient limited by fatigue;Patient tolerated treatment well Patient left: in chair;with call bell/phone within reach;with chair alarm set (BLE elevated) Nurse Communication: Mobility status (BP (notified MD as well)) PT Visit Diagnosis: Unsteadiness on feet (R26.81);Muscle weakness (generalized) (M62.81);Difficulty in walking, not elsewhere classified (R26.2);Pain Pain - Right/Left: Right Pain - part of body: Hip  Time: 4801-6553 PT Time Calculation (min) (ACUTE ONLY): 43 min   Charges:   PT Evaluation $PT Eval Moderate Complexity: 1 Mod PT Treatments $Therapeutic Exercise: 8-22 mins $Therapeutic Activity: 8-22 mins        Lavone Nian, PT, DPT 08/02/21, 2:07 PM   Waunita Schooner 08/02/2021, 2:04 PM

## 2021-08-03 DIAGNOSIS — R443 Hallucinations, unspecified: Secondary | ICD-10-CM

## 2021-08-03 LAB — CBC WITH DIFFERENTIAL/PLATELET
Abs Immature Granulocytes: 0.04 10*3/uL (ref 0.00–0.07)
Basophils Absolute: 0.1 10*3/uL (ref 0.0–0.1)
Basophils Relative: 1 %
Eosinophils Absolute: 0.4 10*3/uL (ref 0.0–0.5)
Eosinophils Relative: 5 %
HCT: 25.8 % — ABNORMAL LOW (ref 39.0–52.0)
Hemoglobin: 8.5 g/dL — ABNORMAL LOW (ref 13.0–17.0)
Immature Granulocytes: 1 %
Lymphocytes Relative: 7 %
Lymphs Abs: 0.6 10*3/uL — ABNORMAL LOW (ref 0.7–4.0)
MCH: 29.7 pg (ref 26.0–34.0)
MCHC: 32.9 g/dL (ref 30.0–36.0)
MCV: 90.2 fL (ref 80.0–100.0)
Monocytes Absolute: 1 10*3/uL (ref 0.1–1.0)
Monocytes Relative: 12 %
Neutro Abs: 6.2 10*3/uL (ref 1.7–7.7)
Neutrophils Relative %: 74 %
Platelets: 140 10*3/uL — ABNORMAL LOW (ref 150–400)
RBC: 2.86 MIL/uL — ABNORMAL LOW (ref 4.22–5.81)
RDW: 13.9 % (ref 11.5–15.5)
WBC: 8.3 10*3/uL (ref 4.0–10.5)
nRBC: 0 % (ref 0.0–0.2)

## 2021-08-03 LAB — COMPREHENSIVE METABOLIC PANEL
ALT: 25 U/L (ref 0–44)
AST: 37 U/L (ref 15–41)
Albumin: 2.5 g/dL — ABNORMAL LOW (ref 3.5–5.0)
Alkaline Phosphatase: 51 U/L (ref 38–126)
Anion gap: 5 (ref 5–15)
BUN: 20 mg/dL (ref 8–23)
CO2: 24 mmol/L (ref 22–32)
Calcium: 8 mg/dL — ABNORMAL LOW (ref 8.9–10.3)
Chloride: 105 mmol/L (ref 98–111)
Creatinine, Ser: 0.97 mg/dL (ref 0.61–1.24)
GFR, Estimated: >60 mL/min (ref >60)
Glucose, Bld: 122 mg/dL — ABNORMAL HIGH (ref 70–99)
Potassium: 3.7 mmol/L (ref 3.5–5.1)
Sodium: 134 mmol/L — ABNORMAL LOW (ref 135–145)
Total Bilirubin: 1 mg/dL (ref 0.3–1.2)
Total Protein: 5 g/dL — ABNORMAL LOW (ref 6.5–8.1)

## 2021-08-03 LAB — MAGNESIUM: Magnesium: 1.9 mg/dL (ref 1.7–2.4)

## 2021-08-03 LAB — PHOSPHORUS: Phosphorus: 1.9 mg/dL — ABNORMAL LOW (ref 2.5–4.6)

## 2021-08-03 MED ORDER — K PHOS MONO-SOD PHOS DI & MONO 155-852-130 MG PO TABS
500.0000 mg | ORAL_TABLET | Freq: Four times a day (QID) | ORAL | Status: AC
  Administered 2021-08-03 – 2021-08-05 (×7): 500 mg via ORAL
  Filled 2021-08-03 (×8): qty 2

## 2021-08-03 MED ORDER — TRAMADOL HCL 50 MG PO TABS
50.0000 mg | ORAL_TABLET | Freq: Four times a day (QID) | ORAL | Status: DC | PRN
  Administered 2021-08-03: 10:00:00 50 mg via ORAL
  Filled 2021-08-03: qty 1

## 2021-08-03 NOTE — Progress Notes (Signed)
PROGRESS NOTE    Jared Castro  TKZ:[id number] DOB: 1932/09/11 DOA: 07/31/2021 PCP: Adin Hector, MD  Chief Complaint  Patient presents with   Fall    Brief Narrative:  87 yo with hx NICM (EF 30-35%, unclear when this echo was done), hx paroxysmal SVT, atrial flutter, biventricular ICD, CAD, HLD and multiple other medical issues presenting after mechanical fallw with right hip fracture.    Assessment & Plan:   Principal Problem:   Right femoral fracture (HCC) Active Problems:   Hallucinations   Nonischemic dilated cardiomyopathy (HCC)   Hypotension   ICD (implantable cardioverter-defibrillator) in place   Essential hypertension   HLD (hyperlipidemia)   Delirium   Chronic midline low back pain   Atrial flutter (HCC)    * Right femoral fracture (HCC) Mechanical fall, no head trauma S/p total hip arthroplasty anterior approach 12/16 Pain regimen, bowel regimen lovenox Pain meds, bowel regimen Ortho recommending therapy, likely SNF, WBAT  Hallucinations Suspect delirium/med effect Continue to monitor Delirium precautions W/u further as needed  Hypotension BP's improved Losartan already on hold. Hold metoprolol for now Trend H/H, gradually downtrending - no need for transfusion yet Repeat echo pending Will follow closely   Nonischemic dilated cardiomyopathy ([place]) Follows with Dr. Ubaldo Glassing, [facility] EF 30-35%, unclear when this echo was done Appreciate cards recs Will repeat echo with hypotension   ICD (implantable cardioverter-defibrillator) in place noted  Essential hypertension Hold lostartan Hold metop, consider resumption later if improved  Delirium Mild and brief Delirium precautions, follow  HLD (hyperlipidemia) Doesn't appear to be on statin  Atrial flutter ([place]) History noted, doesn't appear to be on anticoagulation Per cards   DVT prophylaxis: heparin Code Status: full Family Communication: daughter at bedside Disposition:    Status is: Inpatient  Remains inpatient appropriate because: need for ortho        Consultants:  Cardiology orthopedics  Procedures:  none  Antimicrobials:  Anti-infectives (From admission, onward)    Start     Dose/Rate Route Frequency Ordered Stop   08/02/21 0915  ceFAZolin (ANCEF) IVPB 2g/100 mL premix        2 g 200 mL/hr over 30 Minutes Intravenous  Once 08/01/21 0744 08/01/21 1917   08/02/21 0000  ceFAZolin (ANCEF) IVPB 2g/100 mL premix        2 g 200 mL/hr over 30 Minutes Intravenous Every 6 hours 08/01/21 2143 08/02/21 0608   08/01/21 1553  ceFAZolin (ANCEF) 2-4 GM/100ML-% IVPB       Note to Pharmacy: Herby Abraham W: cabinet override      08/01/21 1553 08/01/21 1853       Subjective: Alert, oriented Notes hallucinations last night  Objective: Vitals:   08/03/21 0457 08/03/21 0820 08/03/21 1115 08/03/21 1513  BP: (!) 104/54 (!) 119/58 106/65 102/61  Pulse: 80 88 (!) 105 93  Resp: 20 18 20 20   Temp: 97.9 F (36.6 C) 98.6 F (37 C) 98.2 F (36.8 C) 98.2 F (36.8 C)  TempSrc:      SpO2: 94% 94% 96% 95%  Weight:      Height:        Intake/Output Summary (Last 24 hours) at 08/03/2021 1855 Last data filed at 08/03/2021 1800 Gross per 24 hour  Intake 1659.2 ml  Output 1250 ml  Net 409.2 ml   Filed Weights   07/31/21 2059  Weight: 72.7 kg    Examination:  General: No acute distress.  Seen walking with therapy with walker., Cardiovascular: RRR Lungs:  unlabored Abdomen: Soft, nontender, nondistended  Neurological: Alert and oriented 3. Moves all extremities 4 . Cranial nerves II through XII grossly intact. Skin: Warm and dry. No rashes or lesions. Extremities: dressomg om [;ace   Data Reviewed: I have personally reviewed following labs and imaging studies  CBC: Recent Labs  Lab 07/31/21 1816 08/01/21 0545 08/01/21 2159 08/02/21 0403 08/02/21 1119 08/02/21 1726 08/03/21 0411  WBC 10.2 8.9 11.7* 10.1  --   --  8.3  NEUTROABS  8.6*  --   --  8.6*  --   --  6.2  HGB 12.3* 11.7* 11.0* 10.1* 9.4* 9.1* 8.5*  HCT 37.3* 34.8* 33.0* 30.5* 28.3* 27.0* 25.8*  MCV 92.3 91.1 90.7 91.6  --   --  90.2  PLT 205 184 153 151  --   --  140*    Basic Metabolic Panel: Recent Labs  Lab 07/31/21 1816 08/01/21 0545 08/01/21 2159 08/02/21 0403 08/03/21 0411  NA 137 137  --  137 134*  K 4.1 4.1  --  3.9 3.7  CL 105 105  --  106 105  CO2 22 22  --  24 24  GLUCOSE 109* 122*  --  116* 122*  BUN 22 22  --  22 20  CREATININE 1.04 1.07 1.02 1.06 0.97  CALCIUM 8.9 8.5*  --  7.8* 8.0*  MG  --   --   --  2.0 1.9  PHOS  --   --   --  3.9 1.9*    GFR: Estimated Creatinine Clearance: 52.9 mL/min (by C-G formula based on SCr of 0.97 mg/dL).  Liver Function Tests: Recent Labs  Lab 08/02/21 0403 08/03/21 0411  AST 25 37  ALT 19 25  ALKPHOS 48 51  BILITOT 1.4* 1.0  PROT 5.3* 5.0*  ALBUMIN 2.9* 2.5*    CBG: No results for input(s): GLUCAP in the last 168 hours.   Recent Results (from the past 240 hour(s))  Resp Panel by RT-PCR (Flu Manuella Blackson&B, Covid) Nasopharyngeal Swab     Status: None   Collection Time: 07/31/21  6:17 PM   Specimen: Nasopharyngeal Swab; Nasopharyngeal(NP) swabs in vial transport medium  Result Value Ref Range Status   SARS Coronavirus 2 by RT PCR NEGATIVE NEGATIVE Final    Comment: (NOTE) SARS-CoV-2 target nucleic acids are NOT DETECTED.  The SARS-CoV-2 RNA is generally detectable in upper respiratory specimens during the acute phase of infection. The lowest concentration of SARS-CoV-2 viral copies this assay can detect is 138 copies/mL. Laiylah Roettger negative result does not preclude SARS-Cov-2 infection and should not be used as the sole basis for treatment or other patient management decisions. Masoud Nyce negative result may occur with  improper specimen collection/handling, submission of specimen other than nasopharyngeal swab, presence of viral mutation(s) within the areas targeted by this assay, and inadequate number  of viral copies(<138 copies/mL). Gretna Bergin negative result must be combined with clinical observations, patient history, and epidemiological information. The expected result is Negative.  Fact Sheet for Patients:  EntrepreneurPulse.com.au  Fact Sheet for Healthcare Providers:  IncredibleEmployment.be  This test is no t yet approved or cleared by the [place] FDA and  has been authorized for detection and/or diagnosis of SARS-CoV-2 by FDA under an Emergency Use Authorization (EUA). This EUA will remain  in effect (meaning this test can be used) for the duration of the COVID-19 declaration under Section 564(b)(1) of the Act, 21 U.S.C.section 360bbb-3(b)(1), unless the authorization is terminated  or revoked sooner.  Influenza Vitalia Stough by PCR NEGATIVE NEGATIVE Final   Influenza B by PCR NEGATIVE NEGATIVE Final    Comment: (NOTE) The Xpert Xpress SARS-CoV-2/FLU/RSV plus assay is intended as an aid in the diagnosis of influenza from Nasopharyngeal swab specimens and should not be used as Lizmarie Witters sole basis for treatment. Nasal washings and aspirates are unacceptable for Xpert Xpress SARS-CoV-2/FLU/RSV testing.  Fact Sheet for Patients: EntrepreneurPulse.com.au  Fact Sheet for Healthcare Providers: IncredibleEmployment.be  This test is not yet approved or cleared by the [place] FDA and has been authorized for detection and/or diagnosis of SARS-CoV-2 by FDA under an Emergency Use Authorization (EUA). This EUA will remain in effect (meaning this test can be used) for the duration of the COVID-19 declaration under Section 564(b)(1) of the Act, 21 U.S.C. section 360bbb-3(b)(1), unless the authorization is terminated or revoked.  Performed at [facility], [address] [place], [place] [address]          Radiology Studies: DG C-Arm 1-60 Min-No Report  Result Date: 08/01/2021 Fluoroscopy was  utilized by the requesting physician.  No radiographic interpretation.   DG HIP UNILAT WITH PELVIS 1V RIGHT  Result Date: 08/01/2021 CLINICAL DATA:  Hip replacement EXAM: DG HIP (WITH OR WITHOUT PELVIS) 1V RIGHT COMPARISON:  07/31/2021 FINDINGS: Four low resolution intraoperative spot views of the right hip. The images were obtained during operative course of right hip replacement. There is normal alignment IMPRESSION: Fluoroscopic assistance provided during right hip replacement. Electronically Signed   By: Donavan Foil M.D.   On: 08/01/2021 21:19   DG HIP UNILAT W OR W/O PELVIS 2-3 VIEWS RIGHT  Result Date: 08/01/2021 CLINICAL DATA:  Postop pain. EXAM: DG HIP (WITH OR WITHOUT PELVIS) 2-3V RIGHT COMPARISON:  None. FINDINGS: The patient is status post right hip replacement. Hardware is in good position. Skin staples are noted. No other acute abnormalities are identified. IMPRESSION: Status post right hip replacement. Hardware is in good position. No cause for pain identified. Electronically Signed   By: Dorise Bullion III M.D.   On: 08/01/2021 22:19        Scheduled Meds:  acetaminophen  1,000 mg Oral Q8H   cholecalciferol  1,000 Units Oral Daily   docusate sodium  100 mg Oral BID   enoxaparin (LOVENOX) injection  40 mg Subcutaneous Q24H   magnesium hydroxide  30 mL Oral QHS   magnesium oxide  400 mg Oral BID   multivitamin with minerals  1 tablet Oral Daily   pantoprazole  40 mg Oral Daily   polyethylene glycol  17 g Oral Daily   Continuous Infusions:  sodium chloride 100 mL/hr at 08/03/21 1707     LOS: 3 days    Time spent: over 30 min    Fayrene Helper, MD [facility]   To contact the attending provider between 7A-7P or the covering provider during after hours 7P-7A, please log into the web site [email or web address] and access using universal [place] password for that web site. If you do not have the password, please call the hospital operator.  08/03/2021, 6:55  PM

## 2021-08-03 NOTE — Assessment & Plan Note (Addendum)
Suspect delirium/med effect Continue to monitor Delirium precautions W/u further as needed - seems improved

## 2021-08-03 NOTE — Progress Notes (Signed)
Subjective: 2 Days Post-Op Procedure(s) (LRB): TOTAL HIP ARTHROPLASTY ANTERIOR APPROACH (Right) Patient reports pain as moderate.   Prior to his fall patient had an appointment with [facility] for cervical spondylosis. Patient is well, pain is improved but reporting some hallucinations. Still hypotensive, holding BP medication. Plan is for d/c to SNF following discharge. Negative for chest pain and shortness of breath Fever: no Gastrointestinal:Negative for nausea and vomiting  Objective: Vital signs in last 24 hours: Temp:  [97.7 F (36.5 C)-98 F (36.7 C)] 97.9 F (36.6 C) (12/18 0457) Pulse Rate:  [80-92] 80 (12/18 0457) Resp:  [18-20] 20 (12/18 0457) BP: (100-104)/(53-85) 104/54 (12/18 0457) SpO2:  [92 %-94 %] 94 % (12/18 0457)  Intake/Output from previous day:  Intake/Output Summary (Last 24 hours) at 08/03/2021 0801 Last data filed at 08/03/2021 0645 Gross per 24 hour  Intake 3281.88 ml  Output 850 ml  Net 2431.88 ml    Intake/Output this shift: No intake/output data recorded.  Labs: Recent Labs    08/01/21 2159 08/02/21 0403 08/02/21 1119 08/02/21 1726 08/03/21 0411  HGB 11.0* 10.1* 9.4* 9.1* 8.5*   Recent Labs    08/02/21 0403 08/02/21 1119 08/02/21 1726 08/03/21 0411  WBC 10.1  --   --  8.3  RBC 3.33*  --   --  2.86*  HCT 30.5*   < > 27.0* 25.8*  PLT 151  --   --  140*   < > = values in this interval not displayed.   Recent Labs    08/02/21 0403 08/03/21 0411  NA 137 134*  K 3.9 3.7  CL 106 105  CO2 24 24  BUN 22 20  CREATININE 1.06 0.97  GLUCOSE 116* 122*  CALCIUM 7.8* 8.0*   Recent Labs    07/31/21 1816  INR 1.1     EXAM General - Patient is Alert, Appropriate, and Oriented Extremity - ABD soft Neurovascular intact Incision: woundvac intact with good seal. No cellulitis present Thigh is soft to palpation. Negative homans Dressing/Incision - Woundvac without any significant drainage at this time. Motor Function -  intact, moving foot and toes well on exam.  Abdomen soft with normal bowel sounds.  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   Weight as of this encounter: 72.7 kg. Advance diet Up with therapy D/C IV fluids when tolerating po intake.  Labs reviewed this AM. WBC 8.3, Hg 8.5.   BP 104/54, hold BP meds.  Plan is for repeat ECHO. Up with therapy today.  Resume pain meds when patient's BP improves. Patient reports some hallucinations, may be from pain medicine.  Will add tramadol to pain regimen, try to use only tramadol and tylenol for discomfort. Plan is for d/c to SNF when able. Ileostomy bag in  place.  He is passing gas.  DVT Prophylaxis - Lovenox and TED hose Weight-Bearing as tolerated to right leg  J. Cameron Proud, PA-C [facility] Orthopaedic Surgery 08/03/2021, 8:01 AM

## 2021-08-03 NOTE — Progress Notes (Signed)
Physical Therapy Treatment Patient Details Name: Jared Castro MRN: [id number] DOB: 01-14-1933 Today's Date: 08/03/2021   History of Present Illness Pt is an 87 y/o M admitted on 07/31/21 with c/c of fall & R hip pain. Imaging found acute displaced R femoral neck fx & pt underwent THA, anterior approach, by Dr. Rudene Christians on 08/01/21. PMH: HTN, paroxysmal SVT, nonischemic dilated cardiomyopathy, a-flutter, biventricular ICD in place, CAD, HLD, ileostomy    PT Comments    Pt seen for PT tx with pt agreeable to tx. Pt continues to c/o hallucinations at night/early morning but reports they go away when nurse checks on pt - MD already aware per chart. Pt performs RLE strengthening exercises with cuing for technique. Pt is able to perform bed mobility without physical assistance but heavy reliance on hospital bed features & cuing for technique. Pt with improving ability to scoot to sitting EOB also. Pt continues to require significant assistance to power up to standing but was able to ambulate into hallway on this date with RW & min assist. BP improved but still with significant change between sitting & standing. Will continue to follow pt acutely to progress mobility as able.  BP checked in LUE: Supine: 107/71 mmHg (MAP 82) Sitting EOB: 159/62 mmHg (MAP 89) Standing at 0: 124/64 mmHg (MAP 80) Sitting recliner after walking: 124/59 mmHg (MAP 77)    Recommendations for follow up therapy are one component of a multi-disciplinary discharge planning process, led by the attending physician.  Recommendations may be updated based on patient status, additional functional criteria and insurance authorization.  Follow Up Recommendations  Skilled nursing-short term rehab (<3 hours/day)     Assistance Recommended at Discharge Frequent or constant Supervision/Assistance  Equipment Recommendations  Rolling walker (2 wheels);BSC/3in1    Recommendations for Other Services       Precautions / Restrictions  Precautions Precautions: Fall Restrictions Weight Bearing Restrictions: Yes RLE Weight Bearing: Weight bearing as tolerated     Mobility  Bed Mobility Overal bed mobility: Needs Assistance Bed Mobility: Supine to Sit     Supine to sit: Supervision;HOB elevated (extra time & cuing to use bed rails to upright trunk)     General bed mobility comments: PT educates pt on lateral leans to increase ease of scooting to EOB once sitting with good return demo by pt.    Transfers Overall transfer level: Needs assistance Equipment used: Rolling walker (2 wheels) Transfers: Sit to/from Stand Sit to Stand: Max assist Stand pivot transfers: Max assist (extra time & assist to power up to standing)              Ambulation/Gait Ambulation/Gait assistance: Min assist Gait Distance (Feet): 40 Feet Assistive device: Rolling walker (2 wheels) Gait Pattern/deviations: Decreased step length - right;Decreased step length - left;Decreased stride length;Decreased dorsiflexion - right;Decreased dorsiflexion - left;Decreased weight shift to right;Trunk flexed Gait velocity: decreased     General Gait Details: Decreased heel strike BLE, decreased foot clearance RLE during swing phase (pt slides it along floor majority of time but is working on stepping).   Stairs             Wheelchair Mobility    Modified Rankin (Stroke Patients Only)       Balance Overall balance assessment: Needs assistance Sitting-balance support: No upper extremity supported;Feet supported Sitting balance-Leahy Scale: Fair     Standing balance support: Bilateral upper extremity supported;Reliant on assistive device for balance;During functional activity Standing balance-Leahy Scale: Poor  Cognition Arousal/Alertness: Awake/alert Behavior During Therapy: WFL for tasks assessed/performed Overall Cognitive Status: Within Functional Limits for tasks assessed                                  General Comments: Pt c/o feeling sleepy during session. Continues to report hallucinations last night but states they go away when nurse comes in to check on him -MD aware per chart.        Exercises Total Joint Exercises Ankle Circles/Pumps: AROM;Both;10 reps;Supine Heel Slides: AAROM;Strengthening;Right;10 reps;Supine Hip ABduction/ADduction: AAROM;Strengthening;Right;10 reps;Supine (hip abduction slides) Straight Leg Raises: AAROM;Strengthening;Right;10 reps;Supine    General Comments        Pertinent Vitals/Pain Pain Assessment: Faces Faces Pain Scale: Hurts a little bit Pain Location: R hip Pain Descriptors / Indicators: Discomfort;Grimacing Pain Intervention(s): Limited activity within patient's tolerance;Monitored during session;Repositioned;Premedicated before session    Home Living                          Prior Function            PT Goals (current goals can now be found in the care plan section) Acute Rehab PT Goals Patient Stated Goal: return to PLOF PT Goal Formulation: With patient Time For Goal Achievement: 08/16/21 Potential to Achieve Goals: Good Progress towards PT goals: Progressing toward goals    Frequency    BID      PT Plan Current plan remains appropriate    Co-evaluation              AM-PAC PT "6 Clicks" Mobility   Outcome Measure  Help needed turning from your back to your side while in a flat bed without using bedrails?: A Little Help needed moving from lying on your back to sitting on the side of a flat bed without using bedrails?: A Little Help needed moving to and from a bed to a chair (including a wheelchair)?: A Little Help needed standing up from a chair using your arms (e.g., wheelchair or bedside chair)?: A Lot Help needed to walk in hospital room?: A Little Help needed climbing 3-5 steps with a railing? : Total 6 Click Score: 15    End of Session Equipment Utilized  During Treatment: Gait belt Activity Tolerance: Patient tolerated treatment well Patient left: with call bell/phone within reach;in chair;with chair alarm set;with family/visitor present Nurse Communication: Mobility status PT Visit Diagnosis: Unsteadiness on feet (R26.81);Muscle weakness (generalized) (M62.81);Difficulty in walking, not elsewhere classified (R26.2);Pain Pain - Right/Left: Right Pain - part of body: Hip     Time: 4742-5956 PT Time Calculation (min) (ACUTE ONLY): 28 min  Charges:  $Therapeutic Activity: 23-37 mins                     Lavone Nian, PT, DPT 08/03/21, 11:21 AM    Waunita Schooner 08/03/2021, 11:19 AM

## 2021-08-03 NOTE — Plan of Care (Signed)
°  Problem: Education: Goal: Verbalization of understanding the information provided (i.e., activity precautions, restrictions, etc) will improve Outcome: Progressing Goal: Individualized Educational Video(s) Outcome: Progressing   Problem: Activity: Goal: Ability to ambulate and perform ADLs will improve Outcome: Progressing   Problem: Clinical Measurements: Goal: Postoperative complications will be avoided or minimized Outcome: Progressing   Problem: Self-Concept: Goal: Ability to maintain and perform role responsibilities to the fullest extent possible will improve Outcome: Progressing   Problem: Pain Management: Goal: Pain level will decrease Outcome: Progressing   Problem: Education: Goal: Knowledge of General Education information will improve Description: Including pain rating scale, medication(s)/side effects and non-pharmacologic comfort measures Outcome: Progressing   Problem: Health Behavior/Discharge Planning: Goal: Ability to manage health-related needs will improve Outcome: Progressing   Problem: Clinical Measurements: Goal: Ability to maintain clinical measurements within normal limits will improve Outcome: Progressing Goal: Will remain free from infection Outcome: Progressing Goal: Diagnostic test results will improve Outcome: Progressing Goal: Respiratory complications will improve Outcome: Progressing Goal: Cardiovascular complication will be avoided Outcome: Progressing   Problem: Activity: Goal: Risk for activity intolerance will decrease Outcome: Progressing   Problem: Nutrition: Goal: Adequate nutrition will be maintained Outcome: Progressing   Problem: Coping: Goal: Level of anxiety will decrease Outcome: Progressing   Problem: Elimination: Goal: Will not experience complications related to bowel motility Outcome: Progressing Goal: Will not experience complications related to urinary retention Outcome: Progressing   Problem: Pain  Managment: Goal: General experience of comfort will improve Outcome: Progressing   Problem: Safety: Goal: Ability to remain free from injury will improve Outcome: Progressing   Problem: Skin Integrity: Goal: Risk for impaired skin integrity will decrease Outcome: Progressing   Problem: Education: Goal: Knowledge of the prescribed therapeutic regimen will improve Outcome: Progressing Goal: Understanding of discharge needs will improve Outcome: Progressing Goal: Individualized Educational Video(s) Outcome: Progressing   Problem: Activity: Goal: Ability to avoid complications of mobility impairment will improve Outcome: Progressing Goal: Ability to tolerate increased activity will improve Outcome: Progressing   Problem: Clinical Measurements: Goal: Postoperative complications will be avoided or minimized Outcome: Progressing   Problem: Pain Management: Goal: Pain level will decrease with appropriate interventions Outcome: Progressing   Problem: Skin Integrity: Goal: Will show signs of wound healing Outcome: Progressing   

## 2021-08-04 ENCOUNTER — Encounter: Payer: Self-pay | Admitting: Orthopedic Surgery

## 2021-08-04 ENCOUNTER — Inpatient Hospital Stay
Admit: 2021-08-04 | Discharge: 2021-08-04 | Disposition: A | Discharge status: Home / Self Care | Payer: Medicare HMO | Attending: Family Medicine | Admitting: Family Medicine

## 2021-08-04 LAB — ECHOCARDIOGRAM COMPLETE
AR max vel: 2.73 cm2
AV Area VTI: 2.38 cm2
AV Area mean vel: 2.75 cm2
AV Mean grad: 4 mmHg
AV Peak grad: 8 mmHg
Ao pk vel: 1.41 m/s
Area-P 1/2: 4.15 cm2
Height: 68 in
MV VTI: 2.8 cm2
S' Lateral: 2.71 cm
Weight: 2564.39 oz

## 2021-08-04 LAB — COMPREHENSIVE METABOLIC PANEL
ALT: 41 U/L (ref 0–44)
AST: 50 U/L — ABNORMAL HIGH (ref 15–41)
Albumin: 2.3 g/dL — ABNORMAL LOW (ref 3.5–5.0)
Alkaline Phosphatase: 65 U/L (ref 38–126)
Anion gap: 5 (ref 5–15)
BUN: 19 mg/dL (ref 8–23)
CO2: 24 mmol/L (ref 22–32)
Calcium: 7.7 mg/dL — ABNORMAL LOW (ref 8.9–10.3)
Chloride: 108 mmol/L (ref 98–111)
Creatinine, Ser: 0.9 mg/dL (ref 0.61–1.24)
GFR, Estimated: >60 mL/min (ref >60)
Glucose, Bld: 110 mg/dL — ABNORMAL HIGH (ref 70–99)
Potassium: 3.6 mmol/L (ref 3.5–5.1)
Sodium: 137 mmol/L (ref 135–145)
Total Bilirubin: 0.8 mg/dL (ref 0.3–1.2)
Total Protein: 4.7 g/dL — ABNORMAL LOW (ref 6.5–8.1)

## 2021-08-04 LAB — HEMOGLOBIN AND HEMATOCRIT, BLOOD
HCT: 25 % — ABNORMAL LOW (ref 39.0–52.0)
Hemoglobin: 8.3 g/dL — ABNORMAL LOW (ref 13.0–17.0)

## 2021-08-04 LAB — CBC WITH DIFFERENTIAL/PLATELET
Abs Immature Granulocytes: 0.03 10*3/uL (ref 0.00–0.07)
Basophils Absolute: 0 10*3/uL (ref 0.0–0.1)
Basophils Relative: 1 %
Eosinophils Absolute: 0.6 10*3/uL — ABNORMAL HIGH (ref 0.0–0.5)
Eosinophils Relative: 9 %
HCT: 22.8 % — ABNORMAL LOW (ref 39.0–52.0)
Hemoglobin: 7.6 g/dL — ABNORMAL LOW (ref 13.0–17.0)
Immature Granulocytes: 0 %
Lymphocytes Relative: 15 %
Lymphs Abs: 1 10*3/uL (ref 0.7–4.0)
MCH: 30.2 pg (ref 26.0–34.0)
MCHC: 33.3 g/dL (ref 30.0–36.0)
MCV: 90.5 fL (ref 80.0–100.0)
Monocytes Absolute: 0.7 10*3/uL (ref 0.1–1.0)
Monocytes Relative: 10 %
Neutro Abs: 4.5 10*3/uL (ref 1.7–7.7)
Neutrophils Relative %: 65 %
Platelets: 138 10*3/uL — ABNORMAL LOW (ref 150–400)
RBC: 2.52 MIL/uL — ABNORMAL LOW (ref 4.22–5.81)
RDW: 14.1 % (ref 11.5–15.5)
WBC: 6.9 10*3/uL (ref 4.0–10.5)
nRBC: 0 % (ref 0.0–0.2)

## 2021-08-04 LAB — MAGNESIUM: Magnesium: 1.7 mg/dL (ref 1.7–2.4)

## 2021-08-04 LAB — PHOSPHORUS: Phosphorus: 2.7 mg/dL (ref 2.5–4.6)

## 2021-08-04 MED ORDER — ENOXAPARIN SODIUM 40 MG/0.4ML IJ SOSY: 40.0000 mg | PREFILLED_SYRINGE | INTRAMUSCULAR | 0 refills | Status: DC

## 2021-08-04 MED ORDER — FE FUMARATE-B12-VIT C-FA-IFC PO CAPS
1.0000 | ORAL_CAPSULE | Freq: Two times a day (BID) | ORAL | Status: DC
Start: 2021-08-04 — End: 2021-08-06
  Administered 2021-08-04 – 2021-08-06 (×5): 1 via ORAL
  Filled 2021-08-04 (×7): qty 1

## 2021-08-04 MED ORDER — OXYCODONE HCL 5 MG PO TABS
5.0000 mg | ORAL_TABLET | Freq: Four times a day (QID) | ORAL | 0 refills | Status: DC | PRN
Start: 2021-08-04 — End: 2022-04-28

## 2021-08-04 NOTE — Progress Notes (Signed)
Occupational Therapy Treatment Patient Details Name: Daley Gosse MRN: [id number] DOB: [birth year] Today's Date: 08/04/2021   History of present illness Pt is an 87 y/o M admitted on 07/31/21 with c/c of fall & R hip pain. Imaging found acute displaced R femoral neck fx & pt underwent THA, anterior approach, by Dr. Rudene Christians on 08/01/21. PMH: HTN, paroxysmal SVT, nonischemic dilated cardiomyopathy, a-flutter, biventricular ICD in place, CAD, HLD, ileostomy   OT comments  Pt seen for OT treatment on this date. Upon arrival to room, pt awake and seated upright in bed. Pt reported feeling sleepier than PT session in AM, however agreeable to OT tx. With HOB flat and without use of bedrails, pt performed supine>sit transfer, requiring no physical assist, only increased time/effort. Once seated EOB, pt demonstrated good static sitting balance. Pt requires MIN GUARD for functional mobility of short household distances with RW and MIN GUARD for [facility] transfers. Pt informed this Pryor Curia that he typically kneels to empty ostomy bag. Pt educated on falls prevention strategies to implement during ostomy care management, with pt in agreement that performing ostomy care while seated would be safest to perform. Pt managed ostomy bag and performed standing hand hygiene, requiring only MIN GUARD this date. Pt is making good progress toward goals and would continue to benefit from skilled OT services to maximize return to PLOF and minimize risk of future falls, injury, caregiver burden, and readmission. Discharge recommendation updated to reflect pt's progress. TOC informed.     Recommendations for follow up therapy are one component of a multi-disciplinary discharge planning process, led by the attending physician.  Recommendations may be updated based on patient status, additional functional criteria and insurance authorization.    Follow Up Recommendations  Home health OT    Assistance Recommended at Discharge Intermittent  Supervision/Assistance  Equipment Recommendations  BSC/3in1       Precautions / Restrictions Precautions Precautions: Fall Restrictions Weight Bearing Restrictions: Yes RLE Weight Bearing: Weight bearing as tolerated       Mobility Bed Mobility Overal bed mobility: Needs Assistance Bed Mobility: Supine to Sit     Supine to sit: Min guard     General bed mobility comments: With [facility] flat and without use of bedrails, able to perform supine>site without physical assistance. Requires increase time/effort    Transfers Overall transfer level: Needs assistance Equipment used: Rolling walker (2 wheels) Transfers: Sit to/from Stand Sit to Stand: Min guard           General transfer comment: x3 sit<>stand transfers, requiring MIN GUARD only     Balance Overall balance assessment: Needs assistance Sitting-balance support: No upper extremity supported;Feet supported Sitting balance-Leahy Scale: Good Sitting balance - Comments: Good sitting balance on BSC when managing ostomy bag   Standing balance support: No upper extremity supported;During functional activity Standing balance-Leahy Scale: Fair Standing balance comment: Requires MIN GUARD for standing hand hygiene                           ADL either performed or assessed with clinical judgement   ADL Overall ADL's : Needs assistance/impaired     Grooming: Wash/dry hands;Min guard;Standing                   Toilet Transfer: Min guard;Ambulation;BSC/3in1   Toileting- Water quality scientist and Hygiene: Min guard;Sit to/from stand Toileting - Clothing Manipulation Details (indicate cue type and reason): MIN GUARD for emptying ostomy bag in sitting and standing position.  Functional mobility during ADLs: Min guard;Rolling walker (2 wheels)      Extremity/Trunk Assessment Upper Extremity Assessment Upper Extremity Assessment: Generalized weakness   Lower Extremity Assessment Lower Extremity  Assessment: Generalized weakness         Cognition Arousal/Alertness: Awake/alert Behavior During Therapy: WFL for tasks assessed/performed Overall Cognitive Status: Within Functional Limits for tasks assessed                                 General Comments: c/o feeling fatigued/sleepy during session          Exercises Other Exercises Other Exercises: Pt informed this Pryor Curia that he typically kneels to empty ostomy bag. Pt educated on falls prevention strategies to implement during ostomy care management, with pt in agreement that performing ostomy care while seated is safest           Pertinent Vitals/ Pain       Pain Assessment: 0-10 Pain Score: 6  Pain Location: R hip Pain Descriptors / Indicators: Discomfort Pain Intervention(s): Limited activity within patient's tolerance;Monitored during session;Patient requesting pain meds-RN notified         Frequency  Min 2X/week        Progress Toward Goals  OT Goals(current goals can now be found in the care plan section)  Progress towards OT goals: Progressing toward goals  Acute Rehab OT Goals Patient Stated Goal: to be able to get out of bed and manage ostomy bag independently OT Goal Formulation: With patient Time For Goal Achievement: 08/16/21 Potential to Achieve Goals: Good  Plan Discharge plan remains appropriate;Frequency remains appropriate       AM-PAC OT "6 Clicks" Daily Activity     Outcome Measure   Help from another person eating meals?: None Help from another person taking care of personal grooming?: A Little Help from another person toileting, which includes using toliet, bedpan, or urinal?: A Little Help from another person bathing (including washing, rinsing, drying)?: A Lot Help from another person to put on and taking off regular upper body clothing?: A Little Help from another person to put on and taking off regular lower body clothing?: A Lot 6 Click Score: 17    End of  Session Equipment Utilized During Treatment: Rolling walker (2 wheels)  OT Visit Diagnosis: Other abnormalities of gait and mobility (R26.89)   Activity Tolerance Patient tolerated treatment well   Patient Left in chair;with call bell/phone within reach;with chair alarm set;with family/visitor present   Nurse Communication Mobility status        Time: 1215-1300 OT Time Calculation (min): 45 min  Charges: OT General Charges $OT Visit: 1 Visit OT Treatments $Self Care/Home Management : 38-52 mins  Fredirick Maudlin, OTR/L [place]

## 2021-08-04 NOTE — Progress Notes (Signed)
° °  Subjective: 3 Days Post-Op Procedure(s) (LRB): TOTAL HIP ARTHROPLASTY ANTERIOR APPROACH (Right) Patient reports pain as mild.   Patient is well, and has had no acute complaints or problems Denies any CP, SOB, ABD pain. We will continue therapy today.  Plan is to go Skilled nursing facility after hospital stay.  Objective: Vital signs in last 24 hours: Temp:  [98.1 F (36.7 C)-98.7 F (37.1 C)] 98.7 F (37.1 C) (12/19 0522) Pulse Rate:  [79-105] 79 (12/19 0522) Resp:  [18-20] 18 (12/19 0522) BP: (102-121)/(61-65) 121/61 (12/19 0522) SpO2:  [95 %-97 %] 95 % (12/19 0522)  Intake/Output from previous day: 12/18 0701 - 12/19 0700 In: 6962 [P.O.:240; I.V.:1100] Out: 1525 [Urine:1000; Stool:525] Intake/Output this shift: No intake/output data recorded.  Recent Labs    08/02/21 0403 08/02/21 1119 08/02/21 1726 08/03/21 0411 08/04/21 0408  HGB 10.1* 9.4* 9.1* 8.5* 7.6*   Recent Labs    08/03/21 0411 08/04/21 0408  WBC 8.3 6.9  RBC 2.86* 2.52*  HCT 25.8* 22.8*  PLT 140* 138*   Recent Labs    08/03/21 0411 08/04/21 0408  NA 134* 137  K 3.7 3.6  CL 105 108  CO2 24 24  BUN 20 19  CREATININE 0.97 0.90  GLUCOSE 122* 110*  CALCIUM 8.0* 7.7*   No results for input(s): LABPT, INR in the last 72 hours.  EXAM General - Patient is Alert, Appropriate, and Oriented Extremity - Neurovascular intact Sensation intact distally Intact pulses distally Dorsiflexion/Plantar flexion intact No cellulitis present Compartment soft Dressing - dressing C/D/I and no drainage, Prevena intact without drainage Motor Function - intact, moving foot and toes well on exam.   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   Weight as of this encounter: 72.7 kg. Advance diet Up with therapy Acute postop blood loss anemia-hemoglobin 7.6.  Continue with iron supplement.  Patient currently asymptomatic, vital signs are stable Pain well controlled Care management to assist with discharge to skilled nursing facility    Please remove provena negative pressure dressing on 08/11/2021 and apply honey comb dressing. Keep dressing clean and dry at all times. Follow-up with [facility] orthopedics in 2 weeks Lovenox 40 mg subcu daily x14 days at discharge  DVT Prophylaxis - Lovenox, Foot Pumps, and TED hose Weight-Bearing as tolerated to right leg   T. Rachelle Hora, PA-C [place] 08/04/2021, 8:48 AM

## 2021-08-04 NOTE — Progress Notes (Signed)
Physical Therapy Treatment Patient Details Name: Jared Castro MRN: [id number] DOB: November 18, 1932 Today's Date: 08/04/2021   History of Present Illness Pt is an 87 y/o M admitted on 07/31/21 with c/c of fall & R hip pain. Imaging found acute displaced R femoral neck fx & pt underwent THA, anterior approach, by Dr. Rudene Christians on 08/01/21. 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 Pt endorses stiffness in R hip that improves with mobility. Pt does require extra time to complete sit>stand but no physical assistance. Pt is able to ambulate into hallway with CGA<>supervision with decreased gait speed. Educated pt on need to ambulate/move every hour to prevent stiffness. Will continue to follow pt acutely.    Recommendations for follow up therapy are one component of a multi-disciplinary discharge planning process, led by the attending physician.  Recommendations may be updated based on patient status, additional functional criteria and insurance authorization.  Follow Up Recommendations  Home health PT     Assistance Recommended at Discharge Frequent or constant Supervision/Assistance  Equipment Recommendations  Rolling walker (2 wheels);BSC/3in1    Recommendations for Other Services       Precautions / Restrictions Precautions Precautions: Fall Restrictions Weight Bearing Restrictions: Yes RLE Weight Bearing: Weight bearing as tolerated     Mobility  Bed Mobility Overal bed mobility: Needs Assistance Bed Mobility: Supine to Sit     Supine to sit: Min guard     General bed mobility comments: With [facility] flat and without use of bedrails, able to perform supine>site without physical assistance. Requires increase time/effort    Transfers Overall transfer level: Needs assistance Equipment used: Rolling walker (2 wheels) Transfers: Sit to/from Stand Sit to  Stand: Supervision           General transfer comment: extra time to transfer to standing as pt c/o stiffness but able to complete without physical assistance    Ambulation/Gait Ambulation/Gait assistance: Min guard;Supervision Gait Distance (Feet): 125 Feet Assistive device: Rolling walker (2 wheels) Gait Pattern/deviations: Decreased step length - right;Decreased step length - left;Decreased stride length;Decreased dorsiflexion - right;Decreased dorsiflexion - left;Decreased weight shift to right;Trunk flexed Gait velocity: Decreased gait speed but increases as he continues to walk.     General Gait Details: Step to fluctuating to step through gait pattern, decreased foot clearance RLE.   Stairs             Wheelchair Mobility    Modified Rankin (Stroke Patients Only)       Balance Overall balance assessment: Needs assistance Sitting-balance support: No upper extremity supported;Feet supported Sitting balance-Leahy Scale: Good Sitting balance - Comments: Good sitting balance on BSC when managing ostomy bag   Standing balance support: During functional activity;Bilateral upper extremity supported Standing balance-Leahy Scale: Fair Standing balance comment: BUE support on RW during gait                            Cognition Arousal/Alertness: Awake/alert Behavior During Therapy: WFL for tasks assessed/performed Overall Cognitive Status: Within Functional Limits for tasks assessed                                 General Comments: c/o feeling fatigued/sleepy during session        Exercises Other Exercises Other Exercises: Pt  informed this Pryor Curia that he typically kneels to empty ostomy bag. Pt educated on falls prevention strategies to implement during ostomy care management, with pt in agreement that performing ostomy care while seated is safest    General Comments        Pertinent Vitals/Pain Pain Assessment: Faces Pain Score:  6  Faces Pain Scale: Hurts even more Pain Location: R hip Pain Descriptors / Indicators:  (stiff) Pain Intervention(s): Monitored during session;Repositioned (R hip "loosens" up with mobility)    Home Living                          Prior Function            PT Goals (current goals can now be found in the care plan section) Acute Rehab PT Goals Patient Stated Goal: get better PT Goal Formulation: With patient/family Time For Goal Achievement: 08/16/21 Potential to Achieve Goals: Good Progress towards PT goals: Progressing toward goals    Frequency    BID      PT Plan Discharge plan needs to be updated    Co-evaluation              AM-PAC PT "6 Clicks" Mobility   Outcome Measure  Help needed turning from your back to your side while in a flat bed without using bedrails?: None Help needed moving from lying on your back to sitting on the side of a flat bed without using bedrails?: A Little Help needed moving to and from a bed to a chair (including a wheelchair)?: A Little Help needed standing up from a chair using your arms (e.g., wheelchair or bedside chair)?: A Little Help needed to walk in hospital room?: A Little Help needed climbing 3-5 steps with a railing? : A Lot 6 Click Score: 18    End of Session Equipment Utilized During Treatment: Gait belt Activity Tolerance: Patient tolerated treatment well Patient left: in chair;with chair alarm set;with call bell/phone within reach Nurse Communication:  (need to empty pt's ostomy bag) PT Visit Diagnosis: Unsteadiness on feet (R26.81);Muscle weakness (generalized) (M62.81);Difficulty in walking, not elsewhere classified (R26.2);Pain Pain - Right/Left: Right Pain - part of body: Hip     Time: 1451-1510 PT Time Calculation (min) (ACUTE ONLY): 19 min  Charges:  $Therapeutic Activity: 8-22 mins                     Lavone Nian, PT, DPT 08/04/21, 4:14 PM    Waunita Schooner 08/04/2021, 4:13  PM

## 2021-08-04 NOTE — Progress Notes (Signed)
*  PRELIMINARY RESULTS* Echocardiogram 2D Echocardiogram has been performed.  Jared Castro Milus Fritze 08/04/2021, 9:09 AM

## 2021-08-04 NOTE — Care Management Important Message (Signed)
Important Message  Patient Details  Name: Jared Castro MRN: [id number] Date of Birth: Apr 04, 1933   Medicare Important Message Given:  Yes     Juliann Pulse A Hadassah Rana 08/04/2021, 2:32 PM

## 2021-08-04 NOTE — Progress Notes (Addendum)
Physical Therapy Treatment Patient Details Name: Jared Castro MRN: [id number] DOB: 09-Jun-1933 Today's Date: 08/04/2021   History of Present Illness Pt is an 87 y/o M admitted on 07/31/21 with c/c of fall & R hip pain. Imaging found acute displaced R femoral neck fx & pt underwent THA, anterior approach, by Dr. Rudene Christians on 08/01/21. PMH: HTN, paroxysmal SVT, nonischemic dilated cardiomyopathy, a-flutter, biventricular ICD in place, CAD, HLD, ileostomy    PT Comments    Pt seen for PT tx with wife present for session. Pt with improved independence with mobility as pt was able to complete bed mobility with supervision albeit with heavy reliance on hospital bed features, sit<>stand & gait with RW & CGA<>supervision. Pt demonstrates improving activity tolerance and is slowly progressing to more normal gait pattern. At this time, will upgrade d/c recommendations to [place] f/u with heavy supervision, although pt & wife are still hoping for pt to d/c to STR (issues with toileting as pt typically knees to empty ostomy bag over toilet) & team made aware.     Recommendations for follow up therapy are one component of a multi-disciplinary discharge planning process, led by the attending physician.  Recommendations may be updated based on patient status, additional functional criteria and insurance authorization.  Follow Up Recommendations  Skilled nursing-short term rehab (<3 hours/day) Addendum: upgrade to [place] Recommended at Discharge Frequent or constant Supervision/Assistance  Equipment Recommendations  Rolling walker (2 wheels);BSC/3in1    Recommendations for Other Services       Precautions / Restrictions Precautions Precautions: Fall Restrictions Weight Bearing Restrictions: Yes RLE Weight Bearing: Weight bearing as tolerated     Mobility  Bed Mobility Overal bed mobility: Needs Assistance Bed Mobility: Supine to Sit     Supine to sit: Supervision;HOB elevated      General bed mobility comments: Does rely on HOB elevated & bed rails to complete supine>sit without physical assistance. Scoots to EOB with improvement & more ease compared to yesterday.    Transfers Overall transfer level: Needs assistance Equipment used: Rolling walker (2 wheels) Transfers: Sit to/from Stand Sit to Stand: Min guard (sit>stand from EOB only slightly elevated)                Ambulation/Gait Ambulation/Gait assistance: Min guard;Supervision Gait Distance (Feet): 200 Feet Assistive device: Rolling walker (2 wheels) Gait Pattern/deviations: Decreased step length - right;Decreased step length - left;Decreased stride length;Decreased dorsiflexion - right;Decreased dorsiflexion - left;Decreased weight shift to right;Trunk flexed Gait velocity: decreased     General Gait Details: Improving foot clearance RLE during swing phase but still decreased dorsifleixon & heel strike.   Stairs             Wheelchair Mobility    Modified Rankin (Stroke Patients Only)       Balance Overall balance assessment: Needs assistance Sitting-balance support: No upper extremity supported;Feet supported Sitting balance-Leahy Scale: Good     Standing balance support: Bilateral upper extremity supported;Reliant on assistive device for balance;During functional activity Standing balance-Leahy Scale: Fair                              Cognition Arousal/Alertness: Awake/alert Behavior During Therapy: WFL for tasks assessed/performed Overall Cognitive Status: Within Functional Limits for tasks assessed  General Comments: Appears more alert on this date.        Exercises      General Comments General comments (skin integrity, edema, etc.): Discussed d/c recommendations with pt & wife (HHPT vs STR) with pt & wife very eager for pt to d/c to STR at [facility]      Pertinent Vitals/Pain Pain Assessment:  Faces Faces Pain Scale: Hurts a little bit Pain Location: R hip Pain Descriptors / Indicators: Discomfort Pain Intervention(s): Premedicated before session    Home Living                          Prior Function            PT Goals (current goals can now be found in the care plan section) Acute Rehab PT Goals Patient Stated Goal: go to rehab PT Goal Formulation: With patient/family Time For Goal Achievement: 08/16/21 Potential to Achieve Goals: Good Progress towards PT goals: Progressing toward goals    Frequency    BID      PT Plan Current plan remains appropriate    Co-evaluation              AM-PAC PT "6 Clicks" Mobility   Outcome Measure  Help needed turning from your back to your side while in a flat bed without using bedrails?: None Help needed moving from lying on your back to sitting on the side of a flat bed without using bedrails?: A Little Help needed moving to and from a bed to a chair (including a wheelchair)?: A Little Help needed standing up from a chair using your arms (e.g., wheelchair or bedside chair)?: A Little Help needed to walk in hospital room?: A Little Help needed climbing 3-5 steps with a railing? : A Lot 6 Click Score: 18    End of Session Equipment Utilized During Treatment: Gait belt Activity Tolerance: Patient tolerated treatment well Patient left: in chair;with chair alarm set;with call bell/phone within reach;with family/visitor present Nurse Communication:  (pt needs ostomy bag empited) PT Visit Diagnosis: Unsteadiness on feet (R26.81);Muscle weakness (generalized) (M62.81);Difficulty in walking, not elsewhere classified (R26.2);Pain Pain - Right/Left: Right Pain - part of body: Hip     Time: 0093-8182 PT Time Calculation (min) (ACUTE ONLY): 19 min  Charges:  $Therapeutic Activity: 8-22 mins                     Lavone Nian, PT, DPT 08/04/21, 4:15 PM    Jared Castro 08/04/2021, 9:57 AM

## 2021-08-04 NOTE — TOC Progression Note (Signed)
Transition of Care [facility]) - Progression Note    Patient Details  Name: Jared Castro MRN: [id number] Date of Birth: [birth year]  Transition of Care [facility]) CM/SW [place], RN Phone Number: 08/04/2021, 2:26 PM  Clinical Narrative:    The patient is doing really well and will go home to his independent villa at [facility] [place], he wants to use The PT and OT there at [facility], I notified Seth Bake at [facility] [place], He needs a 3 in 1 and a RW, [employer] will deliver to the room prior to DC, no additional needs        Expected Discharge Plan and Services                                                 Social Determinants of Health (SDOH) Interventions    Readmission Risk Interventions No flowsheet data found.

## 2021-08-04 NOTE — Progress Notes (Signed)
PROGRESS NOTE    Jared Castro  SWN:[id number] DOB: 1933-04-16 DOA: 07/31/2021 PCP: Adin Hector, MD  Chief Complaint  Patient presents with   Fall    Brief Narrative:  87 yo with hx NICM (EF 30-35%, unclear when this echo was done), hx paroxysmal SVT, atrial flutter, biventricular ICD, CAD, HLD and multiple other medical issues presenting after mechanical fallw with right hip fracture.    Assessment & Plan:   Principal Problem:   Right femoral fracture (HCC) Active Problems:   Hallucinations   Nonischemic dilated cardiomyopathy (HCC)   Hypotension   ICD (implantable cardioverter-defibrillator) in place   Essential hypertension   HLD (hyperlipidemia)   Delirium   Chronic midline low back pain   Atrial flutter (HCC)    * Right femoral fracture (HCC) Mechanical fall, no head trauma S/p total hip arthroplasty anterior approach 12/16 Pain regimen, bowel regimen lovenox Pain meds, bowel regimen Ortho c/s, appreciate recs - recommending removing negative pressure dressing on 12/26 and apply honey comb dressing.  Keep clean and dry.  Ortho f/u in 2 weeks.  Lovenox x14 days.  Hallucinations Suspect delirium/med effect Continue to monitor Delirium precautions W/u further as needed - seems improved  Hypotension BP's improved Losartan already on hold. Hold metoprolol for now Hb trending down, seems asx, may need transfusion if downtrends further Repeat echo - EF 93-81%, grade 1 diastolic dysfunction Will follow closely   Nonischemic dilated cardiomyopathy ([place]) Follows with Dr. Ubaldo Glassing, [facility] Hx EF 30-35%, unclear when this echo was done Repeat echo with EF 82-99%, grade 1 diastolic dysfunction Appreciate cards recs   ICD (implantable cardioverter-defibrillator) in place noted  Essential hypertension Hold lostartan Hold metop, consider resumption later if improved  Delirium Mild and brief Delirium precautions, follow  HLD  (hyperlipidemia) Doesn't appear to be on statin  Atrial flutter ([place]) History noted, doesn't appear to be on anticoagulation Per cards   DVT prophylaxis: heparin Code Status: full Family Communication: daughter at bedside Disposition:   Status is: Inpatient  Remains inpatient appropriate because: need for ortho        Consultants:  Cardiology orthopedics  Procedures:  none  Antimicrobials:  Anti-infectives (From admission, onward)    Start     Dose/Rate Route Frequency Ordered Stop   08/02/21 0915  ceFAZolin (ANCEF) IVPB 2g/100 mL premix        2 g 200 mL/hr over 30 Minutes Intravenous  Once 08/01/21 0744 08/01/21 1917   08/02/21 0000  ceFAZolin (ANCEF) IVPB 2g/100 mL premix        2 g 200 mL/hr over 30 Minutes Intravenous Every 6 hours 08/01/21 2143 08/02/21 0608   08/01/21 1553  ceFAZolin (ANCEF) 2-4 GM/100ML-% IVPB       Note to Pharmacy: Herby Abraham W: cabinet override      08/01/21 1553 08/01/21 1853       Subjective: Discussed concerns regarding d/c  Objective: Vitals:   08/03/21 1513 08/03/21 2009 08/04/21 0522 08/04/21 1137  BP: 102/61 121/61 121/61 (!) 133/59  Pulse: 93 87 79 79  Resp: 20 20 18 16   Temp: 98.2 F (36.8 C) 98.1 F (36.7 C) 98.7 F (37.1 C) (!) 97.4 F (36.3 C)  TempSrc:  Oral Oral   SpO2: 95% 97% 95% 99%  Weight:      Height:        Intake/Output Summary (Last 24 hours) at 08/04/2021 1524 Last data filed at 08/04/2021 1426 Gross per 24 hour  Intake 1460 ml  Output 1250 ml  Net 210 ml   Filed Weights   07/31/21 2059  Weight: 72.7 kg    Examination:  General: No acute distress. Seen working with OT, walking with walker to bathroom. Cardiovascular: RRR Lungs: unlabored Neurological: Alert and oriented 3. Moves all extremities 4 with equal strength. Cranial nerves II through XII grossly intact. Extremities: No clubbing or cyanosis. No edema.   Data Reviewed: I have personally reviewed following labs and  imaging studies  CBC: Recent Labs  Lab 07/31/21 1816 08/01/21 0545 08/01/21 2159 08/02/21 0403 08/02/21 1119 08/02/21 1726 08/03/21 0411 08/04/21 0408 08/04/21 1213  WBC 10.2 8.9 11.7* 10.1  --   --  8.3 6.9  --   NEUTROABS 8.6*  --   --  8.6*  --   --  6.2 4.5  --   HGB 12.3* 11.7* 11.0* 10.1* 9.4* 9.1* 8.5* 7.6* 8.3*  HCT 37.3* 34.8* 33.0* 30.5* 28.3* 27.0* 25.8* 22.8* 25.0*  MCV 92.3 91.1 90.7 91.6  --   --  90.2 90.5  --   PLT 205 184 153 151  --   --  140* 138*  --     Basic Metabolic Panel: Recent Labs  Lab 07/31/21 1816 08/01/21 0545 08/01/21 2159 08/02/21 0403 08/03/21 0411 08/04/21 0408  NA 137 137  --  137 134* 137  K 4.1 4.1  --  3.9 3.7 3.6  CL 105 105  --  106 105 108  CO2 22 22  --  24 24 24   GLUCOSE 109* 122*  --  116* 122* 110*  BUN 22 22  --  22 20 19   CREATININE 1.04 1.07 1.02 1.06 0.97 0.90  CALCIUM 8.9 8.5*  --  7.8* 8.0* 7.7*  MG  --   --   --  2.0 1.9 1.7  PHOS  --   --   --  3.9 1.9* 2.7    GFR: Estimated Creatinine Clearance: 57 mL/min (by C-G formula based on SCr of 0.9 mg/dL).  Liver Function Tests: Recent Labs  Lab 08/02/21 0403 08/03/21 0411 08/04/21 0408  AST 25 37 50*  ALT 19 25 41  ALKPHOS 48 51 65  BILITOT 1.4* 1.0 0.8  PROT 5.3* 5.0* 4.7*  ALBUMIN 2.9* 2.5* 2.3*    CBG: No results for input(s): GLUCAP in the last 168 hours.   Recent Results (from the past 240 hour(s))  Resp Panel by RT-PCR (Flu Lamari Beckles&B, Covid) Nasopharyngeal Swab     Status: None   Collection Time: 07/31/21  6:17 PM   Specimen: Nasopharyngeal Swab; Nasopharyngeal(NP) swabs in vial transport medium  Result Value Ref Range Status   SARS Coronavirus 2 by RT PCR NEGATIVE NEGATIVE Final    Comment: (NOTE) SARS-CoV-2 target nucleic acids are NOT DETECTED.  The SARS-CoV-2 RNA is generally detectable in upper respiratory specimens during the acute phase of infection. The lowest concentration of SARS-CoV-2 viral copies this assay can detect is 138  copies/mL. Hopelynn Gartland negative result does not preclude SARS-Cov-2 infection and should not be used as the sole basis for treatment or other patient management decisions. Shivansh Hardaway negative result may occur with  improper specimen collection/handling, submission of specimen other than nasopharyngeal swab, presence of viral mutation(s) within the areas targeted by this assay, and inadequate number of viral copies(<138 copies/mL). Runell Kovich negative result must be combined with clinical observations, patient history, and epidemiological information. The expected result is Negative.  Fact Sheet for Patients:  EntrepreneurPulse.com.au  Fact Sheet for Healthcare Providers:  IncredibleEmployment.be  This test is no t yet approved or cleared by the [place] and  has been authorized for detection and/or diagnosis of SARS-CoV-2 by FDA under an Emergency Use Authorization (EUA). This EUA will remain  in effect (meaning this test can be used) for the duration of the COVID-19 declaration under Section 564(b)(1) of the Act, 21 U.S.C.section 360bbb-3(b)(1), unless the authorization is terminated  or revoked sooner.       Influenza Abriella Filkins by PCR NEGATIVE NEGATIVE Final   Influenza B by PCR NEGATIVE NEGATIVE Final    Comment: (NOTE) The Xpert Xpress SARS-CoV-2/FLU/RSV plus assay is intended as an aid in the diagnosis of influenza from Nasopharyngeal swab specimens and should not be used as Makayah Pauli sole basis for treatment. Nasal washings and aspirates are unacceptable for Xpert Xpress SARS-CoV-2/FLU/RSV testing.  Fact Sheet for Patients: EntrepreneurPulse.com.au  Fact Sheet for Healthcare Providers: IncredibleEmployment.be  This test is not yet approved or cleared by the [place] FDA and has been authorized for detection and/or diagnosis of SARS-CoV-2 by FDA under an Emergency Use Authorization (EUA). This EUA will remain in effect (meaning  this test can be used) for the duration of the COVID-19 declaration under Section 564(b)(1) of the Act, 21 U.S.C. section 360bbb-3(b)(1), unless the authorization is terminated or revoked.  Performed at [facility], [address] [place], [place] [address]          Radiology Studies: ECHOCARDIOGRAM COMPLETE  Result Date: 08/04/2021    ECHOCARDIOGRAM REPORT   Patient Name:   Jared Castro Date of Exam: 08/04/2021 Medical Rec #:  [id number]     Height:       68.0 in Accession #:    [id number]    Weight:       160.3 lb Date of Birth:  [birth year]     BSA:          1.860 m Patient Age:    82 years      BP:           121/61 mmHg Patient Gender: M             HR:           85 bpm. Exam Location:  [facility] Procedure: 2D Echo, Color Doppler and Cardiac Doppler Indications:     Hypotension  History:         Patient has no prior history of Echocardiogram examinations.                  Pacemaker; Risk Factors:Former Smoker.  Sonographer:     Charmayne Sheer Referring Phys:  [place] Diagnosing Phys: Isaias Cowman MD IMPRESSIONS  1. Left ventricular ejection fraction, by estimation, is 65 to 70%. The left ventricle has normal function. The left ventricle has no regional wall motion abnormalities. Left ventricular diastolic parameters are consistent with Grade I diastolic dysfunction (impaired relaxation).  2. Right ventricular systolic function is normal. The right ventricular size is normal.  3. The mitral valve is normal in structure. Trivial mitral valve regurgitation. No evidence of mitral stenosis.  4. The aortic valve is normal in structure. Aortic valve regurgitation is not visualized. No aortic stenosis is present.  5. The inferior vena cava is normal in size with greater than 50% respiratory variability, suggesting right atrial pressure of 3 mmHg. FINDINGS  Left Ventricle: Left ventricular ejection fraction, by estimation, is 65 to 70%. The left ventricle has normal  function. The left ventricle has  no regional wall motion abnormalities. The left ventricular internal cavity size was normal in size. There is  no left ventricular hypertrophy. Left ventricular diastolic parameters are consistent with Grade I diastolic dysfunction (impaired relaxation). Right Ventricle: The right ventricular size is normal. No increase in right ventricular wall thickness. Right ventricular systolic function is normal. Left Atrium: Left atrial size was normal in size. Right Atrium: Right atrial size was normal in size. Pericardium: There is no evidence of pericardial effusion. Mitral Valve: The mitral valve is normal in structure. Trivial mitral valve regurgitation. No evidence of mitral valve stenosis. MV peak gradient, 5.3 mmHg. The mean mitral valve gradient is 3.0 mmHg. Tricuspid Valve: The tricuspid valve is normal in structure. Tricuspid valve regurgitation is trivial. No evidence of tricuspid stenosis. Aortic Valve: The aortic valve is normal in structure. Aortic valve regurgitation is not visualized. No aortic stenosis is present. Aortic valve mean gradient measures 4.0 mmHg. Aortic valve peak gradient measures 8.0 mmHg. Aortic valve area, by VTI measures 2.38 cm. Pulmonic Valve: The pulmonic valve was normal in structure. Pulmonic valve regurgitation is not visualized. No evidence of pulmonic stenosis. Aorta: The aortic root is normal in size and structure. Venous: The inferior vena cava is normal in size with greater than 50% respiratory variability, suggesting right atrial pressure of 3 mmHg. IAS/Shunts: No atrial level shunt detected by color flow Doppler.  LEFT VENTRICLE PLAX 2D LVIDd:         4.71 cm   Diastology LVIDs:         2.71 cm   LV e' medial:    6.20 cm/s LV PW:         1.32 cm   LV E/e' medial:  13.7 LV IVS:        0.94 cm   LV e' lateral:   7.83 cm/s LVOT diam:     2.30 cm   LV E/e' lateral: 10.8 LV SV:         60 LV SV Index:   32 LVOT Area:     4.15 cm  RIGHT VENTRICLE RV  Basal diam:  3.94 cm LEFT ATRIUM           Index LA diam:      3.50 cm 1.88 cm/m LA Vol (A4C): 29.4 ml 15.80 ml/m  AORTIC VALVE                    PULMONIC VALVE AV Area (Vmax):    2.73 cm     PV Vmax:       1.49 m/s AV Area (Vmean):   2.75 cm     PV Vmean:      92.600 cm/s AV Area (VTI):     2.38 cm     PV VTI:        0.214 m AV Vmax:           141.00 cm/s  PV Peak grad:  8.9 mmHg AV Vmean:          88.000 cm/s  PV Mean grad:  4.0 mmHg AV VTI:            0.253 m AV Peak Grad:      8.0 mmHg AV Mean Grad:      4.0 mmHg LVOT Vmax:         92.70 cm/s LVOT Vmean:        58.300 cm/s LVOT VTI:          0.145 m LVOT/AV VTI  ratio: 0.57  AORTA Ao Root diam: 3.80 cm MITRAL VALVE MV Area (PHT): 4.15 cm    SHUNTS MV Area VTI:   2.80 cm    Systemic VTI:  0.14 m MV Peak grad:  5.3 mmHg    Systemic Diam: 2.30 cm MV Mean grad:  3.0 mmHg MV Vmax:       1.15 m/s MV Vmean:      76.3 cm/s MV Decel Time: 183 msec MV E velocity: 84.80 cm/s MV Cedar Roseman velocity: 98.50 cm/s MV E/Edgar Reisz ratio:  0.86 Isaias Cowman MD Electronically signed by Isaias Cowman MD Signature Date/Time: 08/04/2021/1:45:14 PM    Final         Scheduled Meds:  acetaminophen  1,000 mg Oral Q8H   cholecalciferol  1,000 Units Oral Daily   docusate sodium  100 mg Oral BID   enoxaparin (LOVENOX) injection  40 mg Subcutaneous Q24H   ferrous [id number] C-folic acid  1 capsule Oral BID PC   magnesium hydroxide  30 mL Oral QHS   magnesium oxide  400 mg Oral BID   multivitamin with minerals  1 tablet Oral Daily   pantoprazole  40 mg Oral Daily   phosphorus  500 mg Oral QID   polyethylene glycol  17 g Oral Daily   Continuous Infusions:     LOS: 4 days    Time spent: over 30 min    Fayrene Helper, MD [facility]   To contact the attending provider between 7A-7P or the covering provider during after hours 7P-7A, please log into the web site [email or web address] and access using universal [place] password for that web site. If  you do not have the password, please call the hospital operator.  08/04/2021, 3:24 PM

## 2021-08-05 LAB — BASIC METABOLIC PANEL
Anion gap: 5 (ref 5–15)
BUN: 16 mg/dL (ref 8–23)
CO2: 26 mmol/L (ref 22–32)
Calcium: 7.7 mg/dL — ABNORMAL LOW (ref 8.9–10.3)
Chloride: 106 mmol/L (ref 98–111)
Creatinine, Ser: 0.84 mg/dL (ref 0.61–1.24)
GFR, Estimated: >60 mL/min (ref >60)
Glucose, Bld: 96 mg/dL (ref 70–99)
Potassium: 3.3 mmol/L — ABNORMAL LOW (ref 3.5–5.1)
Sodium: 137 mmol/L (ref 135–145)

## 2021-08-05 LAB — CBC
HCT: 21.8 % — ABNORMAL LOW (ref 39.0–52.0)
HCT: 25.6 % — ABNORMAL LOW (ref 39.0–52.0)
Hemoglobin: 7.3 g/dL — ABNORMAL LOW (ref 13.0–17.0)
Hemoglobin: 8.7 g/dL — ABNORMAL LOW (ref 13.0–17.0)
MCH: 30.4 pg (ref 26.0–34.0)
MCH: 30.1 pg (ref 26.0–34.0)
MCHC: 33.5 g/dL (ref 30.0–36.0)
MCHC: 34 g/dL (ref 30.0–36.0)
MCV: 90.8 fL (ref 80.0–100.0)
MCV: 88.6 fL (ref 80.0–100.0)
Platelets: 170 10*3/uL (ref 150–400)
Platelets: 195 10*3/uL (ref 150–400)
RBC: 2.4 MIL/uL — ABNORMAL LOW (ref 4.22–5.81)
RBC: 2.89 MIL/uL — ABNORMAL LOW (ref 4.22–5.81)
RDW: 13.8 % (ref 11.5–15.5)
RDW: 14.4 % (ref 11.5–15.5)
WBC: 6.1 10*3/uL (ref 4.0–10.5)
WBC: 7 10*3/uL (ref 4.0–10.5)
nRBC: 0 % (ref 0.0–0.2)
nRBC: 0 % (ref 0.0–0.2)

## 2021-08-05 LAB — GLUCOSE, CAPILLARY
Glucose-Capillary: 105 mg/dL — ABNORMAL HIGH (ref 70–99)
Glucose-Capillary: 110 mg/dL — ABNORMAL HIGH (ref 70–99)
Glucose-Capillary: 136 mg/dL — ABNORMAL HIGH (ref 70–99)

## 2021-08-05 LAB — PREPARE RBC (CROSSMATCH)

## 2021-08-05 LAB — MAGNESIUM: Magnesium: 1.7 mg/dL (ref 1.7–2.4)

## 2021-08-05 LAB — PHOSPHORUS: Phosphorus: 3.3 mg/dL (ref 2.5–4.6)

## 2021-08-05 MED ORDER — SODIUM CHLORIDE 0.9% IV SOLUTION: Freq: Once | INTRAVENOUS | Status: AC

## 2021-08-05 MED ORDER — POTASSIUM CHLORIDE CRYS ER 20 MEQ PO TBCR
40.0000 meq | EXTENDED_RELEASE_TABLET | Freq: Once | ORAL | Status: AC
  Administered 2021-08-05: 20:00:00 40 meq via ORAL
  Filled 2021-08-05: qty 2

## 2021-08-05 NOTE — Plan of Care (Signed)
°  Problem: Education: Goal: Verbalization of understanding the information provided (i.e., activity precautions, restrictions, etc) will improve Outcome: Progressing Goal: Individualized Educational Video(s) Outcome: Progressing   Problem: Activity: Goal: Ability to ambulate and perform ADLs will improve Outcome: Progressing   Problem: Clinical Measurements: Goal: Postoperative complications will be avoided or minimized Outcome: Progressing   Problem: Self-Concept: Goal: Ability to maintain and perform role responsibilities to the fullest extent possible will improve Outcome: Progressing   Problem: Pain Management: Goal: Pain level will decrease Outcome: Progressing   Problem: Education: Goal: Knowledge of General Education information will improve Description: Including pain rating scale, medication(s)/side effects and non-pharmacologic comfort measures Outcome: Progressing   Problem: Health Behavior/Discharge Planning: Goal: Ability to manage health-related needs will improve Outcome: Progressing   Problem: Clinical Measurements: Goal: Ability to maintain clinical measurements within normal limits will improve Outcome: Progressing Goal: Will remain free from infection Outcome: Progressing Goal: Diagnostic test results will improve Outcome: Progressing Goal: Respiratory complications will improve Outcome: Progressing Goal: Cardiovascular complication will be avoided Outcome: Progressing   Problem: Activity: Goal: Risk for activity intolerance will decrease Outcome: Progressing   Problem: Nutrition: Goal: Adequate nutrition will be maintained Outcome: Progressing   Problem: Coping: Goal: Level of anxiety will decrease Outcome: Progressing   Problem: Elimination: Goal: Will not experience complications related to bowel motility Outcome: Progressing Goal: Will not experience complications related to urinary retention Outcome: Progressing   Problem: Pain  Managment: Goal: General experience of comfort will improve Outcome: Progressing   Problem: Safety: Goal: Ability to remain free from injury will improve Outcome: Progressing   Problem: Skin Integrity: Goal: Risk for impaired skin integrity will decrease Outcome: Progressing   Problem: Education: Goal: Knowledge of the prescribed therapeutic regimen will improve Outcome: Progressing Goal: Understanding of discharge needs will improve Outcome: Progressing Goal: Individualized Educational Video(s) Outcome: Progressing   Problem: Activity: Goal: Ability to avoid complications of mobility impairment will improve Outcome: Progressing Goal: Ability to tolerate increased activity will improve Outcome: Progressing   Problem: Clinical Measurements: Goal: Postoperative complications will be avoided or minimized Outcome: Progressing   Problem: Pain Management: Goal: Pain level will decrease with appropriate interventions Outcome: Progressing   Problem: Skin Integrity: Goal: Will show signs of wound healing Outcome: Progressing   

## 2021-08-05 NOTE — Progress Notes (Signed)
Patient received PRBC's for post op blood loss anemia. Recheck CBC tongiht

## 2021-08-05 NOTE — Progress Notes (Signed)
Physical Therapy Treatment Patient Details Name: Jared Castro MRN: [id number] DOB: 03/12/1933 Today's Date: 08/05/2021   History of Present Illness Pt is an 87 y/o M admitted on 07/31/21 with c/c of fall & R hip pain. Imaging found acute displaced R femoral neck fx & pt underwent THA, anterior approach, by Dr. Rudene Christians on 08/01/21. PMH: HTN, paroxysmal SVT, nonischemic dilated cardiomyopathy, a-flutter, biventricular ICD in place, CAD, HLD, ileostomy    PT Comments    Pt completed B LE strengthening exercises in sitting due to receiving blood. Will focus on gait training/mobility during pm visit.   Recommendations for follow up therapy are one component of a multi-disciplinary discharge planning process, led by the attending physician.  Recommendations may be updated based on patient status, additional functional criteria and insurance authorization.  Follow Up Recommendations  Home health PT     Assistance Recommended at Discharge Frequent or constant Supervision/Assistance  Equipment Recommendations  Rolling walker (2 wheels);BSC/3in1    Recommendations for Other Services       Precautions / Restrictions Precautions Precautions: Fall Precaution Comments: anterior approach Restrictions Weight Bearing Restrictions: Yes RLE Weight Bearing: Weight bearing as tolerated     Mobility  Bed Mobility                    Transfers                        Ambulation/Gait                   Stairs             Wheelchair Mobility    Modified Rankin (Stroke Patients Only)       Balance                                            Cognition Arousal/Alertness: Awake/alert Behavior During Therapy: WFL for tasks assessed/performed Overall Cognitive Status: Within Functional Limits for tasks assessed                                          Exercises General Exercises - Lower Extremity Long Arc Quad:  AROM;Both;10 reps Hip Flexion/Marching: AROM;Both;10 reps;Seated Toe Raises: AROM;Both;15 reps;Seated Heel Raises: AROM;Both;15 reps;Seated    General Comments General comments (skin integrity, edema, etc.): Pt's HgB 7.3 this am, therefore pt seen for seated strengthening.      Pertinent Vitals/Pain Pain Assessment: No/denies pain    Home Living                          Prior Function            PT Goals (current goals can now be found in the care plan section) Acute Rehab PT Goals Patient Stated Goal: get better    Frequency    BID      PT Plan Discharge plan needs to be updated    Co-evaluation              AM-PAC PT "6 Clicks" Mobility   Outcome Measure  Help needed turning from your back to your side while in a flat bed without using bedrails?: None Help needed moving from lying on your back to sitting  on the side of a flat bed without using bedrails?: A Little Help needed moving to and from a bed to a chair (including a wheelchair)?: A Little Help needed standing up from a chair using your arms (e.g., wheelchair or bedside chair)?: A Little Help needed to walk in hospital room?: A Little Help needed climbing 3-5 steps with a railing? : A Lot 6 Click Score: 18    End of Session   Activity Tolerance: Patient tolerated treatment well Patient left: in chair;with chair alarm set;with call bell/phone within reach Nurse Communication: Mobility status PT Visit Diagnosis: Unsteadiness on feet (R26.81);Muscle weakness (generalized) (M62.81);Difficulty in walking, not elsewhere classified (R26.2);Pain Pain - Right/Left: Right Pain - part of body: Hip     Time:  -     Charges:                       Mikel Cella, PTA   [name] 08/05/2021, 3:24 PM

## 2021-08-05 NOTE — Progress Notes (Signed)
PROGRESS NOTE    Jared Castro  CHE:[id number] DOB: 1933/03/20 DOA: 07/31/2021 PCP: Adin Hector, MD  Chief Complaint  Patient presents with   Fall    Brief Narrative:  87 yo with hx NICM (EF 30-35%, unclear when this echo was done), hx paroxysmal SVT, atrial flutter, biventricular ICD, CAD, HLD and multiple other medical issues presenting after mechanical fall with right hip fracture.  He's now s/p repair by orthopedics.  Post op course complicated by delirium with hallucinations and post op anemia, receiving transfusion on 12/20.  I suspect he'll be able to discharge home to [facility] with home health on 12/21 pending reevaluation by next provider.  See below for additional details    Assessment & Plan:   Principal Problem:   Right femoral fracture ([place]) Active Problems:   Nonischemic dilated cardiomyopathy (HCC)   Hypotension   Hallucinations   ICD (implantable cardioverter-defibrillator) in place   Essential hypertension   HLD (hyperlipidemia)   Delirium   Chronic midline low back pain   Arrhythmia  * Right femoral fracture (HCC) Mechanical fall, no head trauma S/p total hip arthroplasty anterior approach 12/16 Pain regimen, bowel regimen lovenox Pain meds, bowel regimen Ortho c/s, appreciate recs - recommending removing negative pressure dressing on 12/26 and apply honey comb dressing.  Keep clean and dry.  Ortho f/u in 2 weeks.  Lovenox x14 days.  Hallucinations Suspect delirium/med effect Continue to monitor Delirium precautions W/u further as needed - seems improved  Hypotension BP's remain improved Losartan already on hold. Hold metoprolol for now Hb trending down, transfuse today 12/20 and follow Repeat echo - EF 53-61%, grade 1 diastolic dysfunction Will follow closely   Nonischemic dilated cardiomyopathy (Jared Castro) Follows with Dr. Ubaldo Glassing, [facility] Hx EF 30-35%, unclear when this echo was done Repeat echo with EF 44-31%, grade 1 diastolic  dysfunction Appreciate cards recs (12/17 sign off)   ICD (implantable cardioverter-defibrillator) in place noted  Essential hypertension Hold lostartan Hold metop, consider resumption later if improved  Delirium Mild and brief Delirium precautions, follow  HLD (hyperlipidemia) Doesn't appear to be on statin  Arrhythmia ? Hx flutter --- I don't see this elsewhere in chart, will remove - he denies any history - I don't see it in my review of care everywhere notes from Dr. Ubaldo Glassing -> he does have hx NSVT   DVT prophylaxis: heparin Code Status: full Family Communication: daughter at bedside Disposition:   Status is: Inpatient  Remains inpatient appropriate because: need for ortho        Consultants:  Cardiology orthopedics  Procedures:  none  Antimicrobials:  Anti-infectives (From admission, onward)    Start     Dose/Rate Route Frequency Ordered Stop   08/02/21 0915  ceFAZolin (ANCEF) IVPB 2g/100 mL premix        2 g 200 mL/hr over 30 Minutes Intravenous  Once 08/01/21 0744 08/01/21 1917   08/02/21 0000  ceFAZolin (ANCEF) IVPB 2g/100 mL premix        2 g 200 mL/hr over 30 Minutes Intravenous Every 6 hours 08/01/21 2143 08/02/21 0608   08/01/21 1553  ceFAZolin (ANCEF) 2-4 GM/100ML-% IVPB       Note to Pharmacy: Herby Abraham W: cabinet override      08/01/21 1553 08/01/21 1853       Subjective: No complaints  Objective: Vitals:   08/05/21 1104 08/05/21 1123 08/05/21 1421 08/05/21 1517  BP: (!) 124/59 132/60 (!) 105/55 (!) 113/56  Pulse: 77 81 93  93  Resp:  17 18 18   Temp: 97.9 F (36.6 C) 98 F (36.7 C) 98.2 F (36.8 C) (!) 97.3 F (36.3 C)  TempSrc: Oral Oral Oral Oral  SpO2: 97% 99% 98% 98%  Weight:      Height:        Intake/Output Summary (Last 24 hours) at 08/05/2021 1829 Last data filed at 08/05/2021 1720 Gross per 24 hour  Intake 566 ml  Output 1360 ml  Net -794 ml   Filed Weights   07/31/21 2059  Weight: 72.7 kg     Examination:  General: No acute distress. Cardiovascular: RRR Lungs: unlabored Abdomen: Soft, nontender, nondistended Neurological: Alert and oriented 3. Moves all extremities 4 with equal strength. Cranial nerves II through XII grossly intact. Skin: Warm and dry. No rashes or lesions. Extremities: dressing in place  Data Reviewed: I have personally reviewed following labs and imaging studies  CBC: Recent Labs  Lab 07/31/21 1816 08/01/21 0545 08/01/21 2159 08/02/21 0403 08/02/21 1119 08/02/21 1726 08/03/21 0411 08/04/21 0408 08/04/21 1213 08/05/21 0507  WBC 10.2   < > 11.7* 10.1  --   --  8.3 6.9  --  6.1  NEUTROABS 8.6*  --   --  8.6*  --   --  6.2 4.5  --   --   HGB 12.3*   < > 11.0* 10.1*   < > 9.1* 8.5* 7.6* 8.3* 7.3*  HCT 37.3*   < > 33.0* 30.5*   < > 27.0* 25.8* 22.8* 25.0* 21.8*  MCV 92.3   < > 90.7 91.6  --   --  90.2 90.5  --  90.8  PLT 205   < > 153 151  --   --  140* 138*  --  170   < > = values in this interval not displayed.    Basic Metabolic Panel: Recent Labs  Lab 08/01/21 0545 08/01/21 2159 08/02/21 0403 08/03/21 0411 08/04/21 0408 08/05/21 0507  NA 137  --  137 134* 137 137  K 4.1  --  3.9 3.7 3.6 3.3*  CL 105  --  106 105 108 106  CO2 22  --  24 24 24 26   GLUCOSE 122*  --  116* 122* 110* 96  BUN 22  --  22 20 19 16   CREATININE 1.07 1.02 1.06 0.97 0.90 0.84  CALCIUM 8.5*  --  7.8* 8.0* 7.7* 7.7*  MG  --   --  2.0 1.9 1.7 1.7  PHOS  --   --  3.9 1.9* 2.7 3.3    GFR: Estimated Creatinine Clearance: 61.1 mL/min (by C-G formula based on SCr of 0.84 mg/dL).  Liver Function Tests: Recent Labs  Lab 08/02/21 0403 08/03/21 0411 08/04/21 0408  AST 25 37 50*  ALT 19 25 41  ALKPHOS 48 51 65  BILITOT 1.4* 1.0 0.8  PROT 5.3* 5.0* 4.7*  ALBUMIN 2.9* 2.5* 2.3*    CBG: Recent Labs  Lab 08/05/21 1145 08/05/21 1648  GLUCAP 105* 110*     Recent Results (from the past 240 hour(s))  Resp Panel by RT-PCR (Flu Abigayl Hor&B, Covid)  Nasopharyngeal Swab     Status: None   Collection Time: 07/31/21  6:17 PM   Specimen: Nasopharyngeal Swab; Nasopharyngeal(NP) swabs in vial transport medium  Result Value Ref Range Status   SARS Coronavirus 2 by RT PCR NEGATIVE NEGATIVE Final    Comment: (NOTE) SARS-CoV-2 target nucleic acids are NOT DETECTED.  The SARS-CoV-2 RNA is generally  detectable in upper respiratory specimens during the acute phase of infection. The lowest concentration of SARS-CoV-2 viral copies this assay can detect is 138 copies/mL. Lillymae Duet negative result does not preclude SARS-Cov-2 infection and should not be used as the sole basis for treatment or other patient management decisions. Masson Nalepa negative result may occur with  improper specimen collection/handling, submission of specimen other than nasopharyngeal swab, presence of viral mutation(s) within the areas targeted by this assay, and inadequate number of viral copies(<138 copies/mL). Charliene Inoue negative result must be combined with clinical observations, patient history, and epidemiological information. The expected result is Negative.  Fact Sheet for Patients:  EntrepreneurPulse.com.au  Fact Sheet for Healthcare Providers:  IncredibleEmployment.be  This test is no t yet approved or cleared by the [place] FDA and  has been authorized for detection and/or diagnosis of SARS-CoV-2 by FDA under an Emergency Use Authorization (EUA). This EUA will remain  in effect (meaning this test can be used) for the duration of the COVID-19 declaration under Section 564(b)(1) of the Act, 21 U.S.C.section 360bbb-3(b)(1), unless the authorization is terminated  or revoked sooner.       Influenza Daniil Labarge by PCR NEGATIVE NEGATIVE Final   Influenza B by PCR NEGATIVE NEGATIVE Final    Comment: (NOTE) The Xpert Xpress SARS-CoV-2/FLU/RSV plus assay is intended as an aid in the diagnosis of influenza from Nasopharyngeal swab specimens and should not be  used as Velisa Regnier sole basis for treatment. Nasal washings and aspirates are unacceptable for Xpert Xpress SARS-CoV-2/FLU/RSV testing.  Fact Sheet for Patients: EntrepreneurPulse.com.au  Fact Sheet for Healthcare Providers: IncredibleEmployment.be  This test is not yet approved or cleared by the [place] FDA and has been authorized for detection and/or diagnosis of SARS-CoV-2 by FDA under an Emergency Use Authorization (EUA). This EUA will remain in effect (meaning this test can be used) for the duration of the COVID-19 declaration under Section 564(b)(1) of the Act, 21 U.S.C. section 360bbb-3(b)(1), unless the authorization is terminated or revoked.  Performed at [facility], [address] [place], [place] [address]          Radiology Studies: ECHOCARDIOGRAM COMPLETE  Result Date: 08/04/2021    ECHOCARDIOGRAM REPORT   Patient Name:   ARGEL PABLO Date of Exam: 08/04/2021 Medical Rec #:  [id number]     Height:       68.0 in Accession #:    [id number]    Weight:       160.3 lb Date of Birth:  July 14, 1933     BSA:          1.860 m Patient Age:    63 years      BP:           121/61 mmHg Patient Gender: M             HR:           85 bpm. Exam Location:  [facility] Procedure: 2D Echo, Color Doppler and Cardiac Doppler Indications:     Hypotension  History:         Patient has no prior history of Echocardiogram examinations.                  Pacemaker; Risk Factors:Former Smoker.  Sonographer:     Charmayne Sheer Referring Phys:  Emmons Diagnosing Phys: Isaias Cowman MD IMPRESSIONS  1. Left ventricular ejection fraction, by estimation, is 65 to 70%. The left ventricle has normal function. The left ventricle has no  regional wall motion abnormalities. Left ventricular diastolic parameters are consistent with Grade I diastolic dysfunction (impaired relaxation).  2. Right ventricular systolic function is normal. The right ventricular  size is normal.  3. The mitral valve is normal in structure. Trivial mitral valve regurgitation. No evidence of mitral stenosis.  4. The aortic valve is normal in structure. Aortic valve regurgitation is not visualized. No aortic stenosis is present.  5. The inferior vena cava is normal in size with greater than 50% respiratory variability, suggesting right atrial pressure of 3 mmHg. FINDINGS  Left Ventricle: Left ventricular ejection fraction, by estimation, is 65 to 70%. The left ventricle has normal function. The left ventricle has no regional wall motion abnormalities. The left ventricular internal cavity size was normal in size. There is  no left ventricular hypertrophy. Left ventricular diastolic parameters are consistent with Grade I diastolic dysfunction (impaired relaxation). Right Ventricle: The right ventricular size is normal. No increase in right ventricular wall thickness. Right ventricular systolic function is normal. Left Atrium: Left atrial size was normal in size. Right Atrium: Right atrial size was normal in size. Pericardium: There is no evidence of pericardial effusion. Mitral Valve: The mitral valve is normal in structure. Trivial mitral valve regurgitation. No evidence of mitral valve stenosis. MV peak gradient, 5.3 mmHg. The mean mitral valve gradient is 3.0 mmHg. Tricuspid Valve: The tricuspid valve is normal in structure. Tricuspid valve regurgitation is trivial. No evidence of tricuspid stenosis. Aortic Valve: The aortic valve is normal in structure. Aortic valve regurgitation is not visualized. No aortic stenosis is present. Aortic valve mean gradient measures 4.0 mmHg. Aortic valve peak gradient measures 8.0 mmHg. Aortic valve area, by VTI measures 2.38 cm. Pulmonic Valve: The pulmonic valve was normal in structure. Pulmonic valve regurgitation is not visualized. No evidence of pulmonic stenosis. Aorta: The aortic root is normal in size and structure. Venous: The inferior vena cava is  normal in size with greater than 50% respiratory variability, suggesting right atrial pressure of 3 mmHg. IAS/Shunts: No atrial level shunt detected by color flow Doppler.  LEFT VENTRICLE PLAX 2D LVIDd:         4.71 cm   Diastology LVIDs:         2.71 cm   LV e' medial:    6.20 cm/s LV PW:         1.32 cm   LV E/e' medial:  13.7 LV IVS:        0.94 cm   LV e' lateral:   7.83 cm/s LVOT diam:     2.30 cm   LV E/e' lateral: 10.8 LV SV:         60 LV SV Index:   32 LVOT Area:     4.15 cm  RIGHT VENTRICLE RV Basal diam:  3.94 cm LEFT ATRIUM           Index LA diam:      3.50 cm 1.88 cm/m LA Vol (A4C): 29.4 ml 15.80 ml/m  AORTIC VALVE                    PULMONIC VALVE AV Area (Vmax):    2.73 cm     PV Vmax:       1.49 m/s AV Area (Vmean):   2.75 cm     PV Vmean:      92.600 cm/s AV Area (VTI):     2.38 cm     PV VTI:  0.214 m AV Vmax:           141.00 cm/s  PV Peak grad:  8.9 mmHg AV Vmean:          88.000 cm/s  PV Mean grad:  4.0 mmHg AV VTI:            0.253 m AV Peak Grad:      8.0 mmHg AV Mean Grad:      4.0 mmHg LVOT Vmax:         92.70 cm/s LVOT Vmean:        58.300 cm/s LVOT VTI:          0.145 m LVOT/AV VTI ratio: 0.57  AORTA Ao Root diam: 3.80 cm MITRAL VALVE MV Area (PHT): 4.15 cm    SHUNTS MV Area VTI:   2.80 cm    Systemic VTI:  0.14 m MV Peak grad:  5.3 mmHg    Systemic Diam: 2.30 cm MV Mean grad:  3.0 mmHg MV Vmax:       1.15 m/s MV Vmean:      76.3 cm/s MV Decel Time: 183 msec MV E velocity: 84.80 cm/s MV Keerthi Hazell velocity: 98.50 cm/s MV E/Adryen Cookson ratio:  0.86 Isaias Cowman MD Electronically signed by Isaias Cowman MD Signature Date/Time: 08/04/2021/1:45:14 PM    Final         Scheduled Meds:  cholecalciferol  1,000 Units Oral Daily   docusate sodium  100 mg Oral BID   enoxaparin (LOVENOX) injection  40 mg Subcutaneous Q24H   ferrous [id number] C-folic acid  1 capsule Oral BID PC   magnesium hydroxide  30 mL Oral QHS   magnesium oxide  400 mg Oral BID   multivitamin with  minerals  1 tablet Oral Daily   pantoprazole  40 mg Oral Daily   polyethylene glycol  17 g Oral Daily   potassium chloride  40 mEq Oral Once   Continuous Infusions:     LOS: 5 days    Time spent: over 30 min    Fayrene Helper, MD [facility]   To contact the attending provider between 7A-7P or the covering provider during after hours 7P-7A, please log into the web site [email or web address] and access using universal [place] password for that web site. If you do not have the password, please call the hospital operator.  08/05/2021, 6:29 PM

## 2021-08-05 NOTE — Progress Notes (Signed)
Physical Therapy Treatment Patient Details Name: Jared Castro MRN: [id number] DOB: 1933-03-29 Today's Date: 08/05/2021   History of Present Illness Pt is an 87 y/o M admitted on 07/31/21 with c/c of fall & R hip pain. Imaging found acute displaced R femoral neck fx & pt underwent THA, anterior approach, by Dr. Rudene Christians on 08/01/21. PMH: HTN, paroxysmal SVT, nonischemic dilated cardiomyopathy, a-flutter, biventricular ICD in place, CAD, HLD, ileostomy    PT Comments    Pt received in bed, c/o fatigue but wanting to work on walking. Pt required CGA/Supervision to transition to edge of bed. Sit to stand with CGA and vc's for hand placement. Pt completed 124ft of gait training with support of RW and CGA. Slow cadence, no c/o dizziness, 4/10 R hip pain with wt bearing. MinA to return to bed for management of R LE.  Pt's HgB to be checked again tonight. Plan is for d/c home tomorrow. Will plan to see pt in am to assess functional well being and safety. HHPT   Recommendations for follow up therapy are one component of a multi-disciplinary discharge planning process, led by the attending physician.  Recommendations may be updated based on patient status, additional functional criteria and insurance authorization.  Follow Up Recommendations  Home health PT     Assistance Recommended at Discharge Frequent or constant Supervision/Assistance  Equipment Recommendations  Rolling walker (2 wheels);BSC/3in1    Recommendations for Other Services       Precautions / Restrictions Precautions Precautions: Fall Precaution Comments: anterior approach Restrictions Weight Bearing Restrictions: Yes RLE Weight Bearing: Weight bearing as tolerated     Mobility  Bed Mobility Overal bed mobility: Needs Assistance Bed Mobility: Supine to Sit     Supine to sit: Min guard Sit to supine: Min assist (for R LE)        Transfers Overall transfer level: Needs assistance Equipment used: Rolling walker (2  wheels) Transfers: Sit to/from Stand Sit to Stand: Supervision           General transfer comment:  (increased time and vc's for hand placement)    Ambulation/Gait Ambulation/Gait assistance: Min guard;Supervision Gait Distance (Feet): 125 Feet Assistive device: Rolling walker (2 wheels) Gait Pattern/deviations: Decreased step length - right;Decreased step length - left;Decreased stride length;Decreased dorsiflexion - right;Decreased dorsiflexion - left;Decreased weight shift to right;Trunk flexed Gait velocity: Decreased gait speed but increases as he continues to walk.     General Gait Details:  (shakey in standing and c/o feeling weak)   Stairs Stairs:  (Pt has ramp)           Wheelchair Mobility    Modified Rankin (Stroke Patients Only)       Balance                                            Cognition Arousal/Alertness: Awake/alert Behavior During Therapy: WFL for tasks assessed/performed Overall Cognitive Status: Within Functional Limits for tasks assessed                                 General Comments:  (c/o fatigue, no dizziness)        Exercises General Exercises - Lower Extremity Long Arc Quad: AROM;Both;10 reps Hip Flexion/Marching: AROM;Both;10 reps;Seated Toe Raises: AROM;Both;15 reps;Seated Heel Raises: AROM;Both;15 reps;Seated    General Comments General comments (  skin integrity, edema, etc.):  (Pt received blood earlier, nursing cleared for gait trainin)      Pertinent Vitals/Pain Pain Assessment: 0-10 Pain Score: 4  Pain Location: R hip during weight bearing Pain Descriptors / Indicators: Discomfort Pain Intervention(s): Monitored during session    Home Living                          Prior Function            PT Goals (current goals can now be found in the care plan section) Acute Rehab PT Goals Patient Stated Goal: get better Progress towards PT goals: Progressing toward  goals    Frequency    BID      PT Plan Current plan remains appropriate    Co-evaluation              AM-PAC PT "6 Clicks" Mobility   Outcome Measure  Help needed turning from your back to your side while in a flat bed without using bedrails?: None Help needed moving from lying on your back to sitting on the side of a flat bed without using bedrails?: A Little Help needed moving to and from a bed to a chair (including a wheelchair)?: A Little Help needed standing up from a chair using your arms (e.g., wheelchair or bedside chair)?: A Little Help needed to walk in hospital room?: A Little Help needed climbing 3-5 steps with a railing? : A Lot 6 Click Score: 18    End of Session Equipment Utilized During Treatment: Gait belt Activity Tolerance: Patient tolerated treatment well Patient left: in bed;with call bell/phone within reach;with bed alarm set Nurse Communication: Mobility status PT Visit Diagnosis: Unsteadiness on feet (R26.81);Muscle weakness (generalized) (M62.81);Difficulty in walking, not elsewhere classified (R26.2);Pain Pain - Right/Left: Right Pain - part of body: Hip     Time: 6568-1275 PT Time Calculation (min) (ACUTE ONLY): 23 min  Charges:  $Gait Training: 8-22 mins $Therapeutic Activity: 8-22 mins                    Mikel Cella, PTA    Jared Castro 08/05/2021, 6:13 PM

## 2021-08-05 NOTE — Discharge Summary (Deleted)
PROGRESS NOTE    Jared Castro  MCN:[id number] DOB: 12-18-1932 DOA: 07/31/2021 PCP: Adin Hector, MD  Chief Complaint  Patient presents with   Fall    Brief Narrative:  87 yo with hx NICM (EF 30-35%, unclear when this echo was done), hx paroxysmal SVT, atrial flutter, biventricular ICD, CAD, HLD and multiple other medical issues presenting after mechanical fall with right hip fracture.  He's now s/p repair by orthopedics.  Post op course complicated by delirium with hallucinations and post op anemia, receiving transfusion on 12/20.  I suspect he'll be able to discharge home to [facility] with home health on 12/21 pending reevaluation by next provider.  See below for additional details    Assessment & Plan:   Principal Problem:   Right femoral fracture ([place]) Active Problems:   Nonischemic dilated cardiomyopathy (HCC)   Hypotension   Hallucinations   ICD (implantable cardioverter-defibrillator) in place   Essential hypertension   HLD (hyperlipidemia)   Delirium   Chronic midline low back pain   Arrhythmia  * Right femoral fracture (HCC) Mechanical fall, no head trauma S/p total hip arthroplasty anterior approach 12/16 Pain regimen, bowel regimen lovenox Pain meds, bowel regimen Ortho c/s, appreciate recs - recommending removing negative pressure dressing on 12/26 and apply honey comb dressing.  Keep clean and dry.  Ortho f/u in 2 weeks.  Lovenox x14 days.  Hallucinations Suspect delirium/med effect Continue to monitor Delirium precautions W/u further as needed - seems improved  Hypotension BP's remain improved Losartan already on hold. Hold metoprolol for now Hb trending down, transfuse today 12/20 and follow Repeat echo - EF 62-94%, grade 1 diastolic dysfunction Will follow closely   Nonischemic dilated cardiomyopathy ([place]) Follows with Dr. Ubaldo Glassing, [facility] Hx EF 30-35%, unclear when this echo was done Repeat echo with EF 76-54%, grade 1 diastolic  dysfunction Appreciate cards recs (12/17 sign off)   ICD (implantable cardioverter-defibrillator) in place noted  Essential hypertension Hold lostartan Hold metop, consider resumption later if improved  Delirium Mild and brief Delirium precautions, follow  HLD (hyperlipidemia) Doesn't appear to be on statin  Arrhythmia ? Hx flutter --- I don't see this elsewhere in chart, will remove - he denies any history - I don't see it in my review of care everywhere notes from Dr. Ubaldo Glassing -> he does have hx NSVT   DVT prophylaxis: heparin Code Status: full Family Communication: daughter at bedside Disposition:   Status is: Inpatient  Remains inpatient appropriate because: need for ortho        Consultants:  Cardiology orthopedics  Procedures:  none  Antimicrobials:  Anti-infectives (From admission, onward)    Start     Dose/Rate Route Frequency Ordered Stop   08/02/21 0915  ceFAZolin (ANCEF) IVPB 2g/100 mL premix        2 g 200 mL/hr over 30 Minutes Intravenous  Once 08/01/21 0744 08/01/21 1917   08/02/21 0000  ceFAZolin (ANCEF) IVPB 2g/100 mL premix        2 g 200 mL/hr over 30 Minutes Intravenous Every 6 hours 08/01/21 2143 08/02/21 0608   08/01/21 1553  ceFAZolin (ANCEF) 2-4 GM/100ML-% IVPB       Note to Pharmacy: Herby Abraham W: cabinet override      08/01/21 1553 08/01/21 1853       Subjective: No complaints  Objective: Vitals:   08/05/21 1104 08/05/21 1123 08/05/21 1421 08/05/21 1517  BP: (!) 124/59 132/60 (!) 105/55 (!) 113/56  Pulse: 77 81 93  93  Resp:  17 18 18   Temp: 97.9 F (36.6 C) 98 F (36.7 C) 98.2 F (36.8 C) (!) 97.3 F (36.3 C)  TempSrc: Oral Oral Oral Oral  SpO2: 97% 99% 98% 98%  Weight:      Height:        Intake/Output Summary (Last 24 hours) at 08/05/2021 1823 Last data filed at 08/05/2021 1720 Gross per 24 hour  Intake 566 ml  Output 1360 ml  Net -794 ml   Filed Weights   07/31/21 2059  Weight: 72.7 kg     Examination:  General: No acute distress. Cardiovascular: RRR Lungs: unlabored Abdomen: Soft, nontender, nondistended Neurological: Alert and oriented 3. Moves all extremities 4 with equal strength. Cranial nerves II through XII grossly intact. Skin: Warm and dry. No rashes or lesions. Extremities: dressing in place  Data Reviewed: I have personally reviewed following labs and imaging studies  CBC: Recent Labs  Lab 07/31/21 1816 08/01/21 0545 08/01/21 2159 08/02/21 0403 08/02/21 1119 08/02/21 1726 08/03/21 0411 08/04/21 0408 08/04/21 1213 08/05/21 0507  WBC 10.2   < > 11.7* 10.1  --   --  8.3 6.9  --  6.1  NEUTROABS 8.6*  --   --  8.6*  --   --  6.2 4.5  --   --   HGB 12.3*   < > 11.0* 10.1*   < > 9.1* 8.5* 7.6* 8.3* 7.3*  HCT 37.3*   < > 33.0* 30.5*   < > 27.0* 25.8* 22.8* 25.0* 21.8*  MCV 92.3   < > 90.7 91.6  --   --  90.2 90.5  --  90.8  PLT 205   < > 153 151  --   --  140* 138*  --  170   < > = values in this interval not displayed.    Basic Metabolic Panel: Recent Labs  Lab 08/01/21 0545 08/01/21 2159 08/02/21 0403 08/03/21 0411 08/04/21 0408 08/05/21 0507  NA 137  --  137 134* 137 137  K 4.1  --  3.9 3.7 3.6 3.3*  CL 105  --  106 105 108 106  CO2 22  --  24 24 24 26   GLUCOSE 122*  --  116* 122* 110* 96  BUN 22  --  22 20 19 16   CREATININE 1.07 1.02 1.06 0.97 0.90 0.84  CALCIUM 8.5*  --  7.8* 8.0* 7.7* 7.7*  MG  --   --  2.0 1.9 1.7 1.7  PHOS  --   --  3.9 1.9* 2.7 3.3    GFR: Estimated Creatinine Clearance: 61.1 mL/min (by C-G formula based on SCr of 0.84 mg/dL).  Liver Function Tests: Recent Labs  Lab 08/02/21 0403 08/03/21 0411 08/04/21 0408  AST 25 37 50*  ALT 19 25 41  ALKPHOS 48 51 65  BILITOT 1.4* 1.0 0.8  PROT 5.3* 5.0* 4.7*  ALBUMIN 2.9* 2.5* 2.3*    CBG: Recent Labs  Lab 08/05/21 1145 08/05/21 1648  GLUCAP 105* 110*     Recent Results (from the past 240 hour(s))  Resp Panel by RT-PCR (Flu Erandy Mceachern&B, Covid)  Nasopharyngeal Swab     Status: None   Collection Time: 07/31/21  6:17 PM   Specimen: Nasopharyngeal Swab; Nasopharyngeal(NP) swabs in vial transport medium  Result Value Ref Range Status   SARS Coronavirus 2 by RT PCR NEGATIVE NEGATIVE Final    Comment: (NOTE) SARS-CoV-2 target nucleic acids are NOT DETECTED.  The SARS-CoV-2 RNA is generally  detectable in upper respiratory specimens during the acute phase of infection. The lowest concentration of SARS-CoV-2 viral copies this assay can detect is 138 copies/mL. Julio Storr negative result does not preclude SARS-Cov-2 infection and should not be used as the sole basis for treatment or other patient management decisions. Dayanara Sherrill negative result may occur with  improper specimen collection/handling, submission of specimen other than nasopharyngeal swab, presence of viral mutation(s) within the areas targeted by this assay, and inadequate number of viral copies(<138 copies/mL). Jentzen Minasyan negative result must be combined with clinical observations, patient history, and epidemiological information. The expected result is Negative.  Fact Sheet for Patients:  EntrepreneurPulse.com.au  Fact Sheet for Healthcare Providers:  IncredibleEmployment.be  This test is no t yet approved or cleared by the [place] FDA and  has been authorized for detection and/or diagnosis of SARS-CoV-2 by FDA under an Emergency Use Authorization (EUA). This EUA will remain  in effect (meaning this test can be used) for the duration of the COVID-19 declaration under Section 564(b)(1) of the Act, 21 U.S.C.section 360bbb-3(b)(1), unless the authorization is terminated  or revoked sooner.       Influenza Oziel Beitler by PCR NEGATIVE NEGATIVE Final   Influenza B by PCR NEGATIVE NEGATIVE Final    Comment: (NOTE) The Xpert Xpress SARS-CoV-2/FLU/RSV plus assay is intended as an aid in the diagnosis of influenza from Nasopharyngeal swab specimens and should not be  used as Cayleigh Paull sole basis for treatment. Nasal washings and aspirates are unacceptable for Xpert Xpress SARS-CoV-2/FLU/RSV testing.  Fact Sheet for Patients: EntrepreneurPulse.com.au  Fact Sheet for Healthcare Providers: IncredibleEmployment.be  This test is not yet approved or cleared by the [place] FDA and has been authorized for detection and/or diagnosis of SARS-CoV-2 by FDA under an Emergency Use Authorization (EUA). This EUA will remain in effect (meaning this test can be used) for the duration of the COVID-19 declaration under Section 564(b)(1) of the Act, 21 U.S.C. section 360bbb-3(b)(1), unless the authorization is terminated or revoked.  Performed at [facility], [address] [place], [place] [address]          Radiology Studies: ECHOCARDIOGRAM COMPLETE  Result Date: 08/04/2021    ECHOCARDIOGRAM REPORT   Patient Name:   MARKEVIOUS EHMKE Date of Exam: 08/04/2021 Medical Rec #:  [id number]     Height:       68.0 in Accession #:    [id number]    Weight:       160.3 lb Date of Birth:  February 22, 1933     BSA:          1.860 m Patient Age:    47 years      BP:           121/61 mmHg Patient Gender: M             HR:           85 bpm. Exam Location:  [facility] Procedure: 2D Echo, Color Doppler and Cardiac Doppler Indications:     Hypotension  History:         Patient has no prior history of Echocardiogram examinations.                  Pacemaker; Risk Factors:Former Smoker.  Sonographer:     Charmayne Sheer Referring Phys:  [place] Diagnosing Phys: Isaias Cowman MD IMPRESSIONS  1. Left ventricular ejection fraction, by estimation, is 65 to 70%. The left ventricle has normal function. The left ventricle has no  regional wall motion abnormalities. Left ventricular diastolic parameters are consistent with Grade I diastolic dysfunction (impaired relaxation).  2. Right ventricular systolic function is normal. The right ventricular  size is normal.  3. The mitral valve is normal in structure. Trivial mitral valve regurgitation. No evidence of mitral stenosis.  4. The aortic valve is normal in structure. Aortic valve regurgitation is not visualized. No aortic stenosis is present.  5. The inferior vena cava is normal in size with greater than 50% respiratory variability, suggesting right atrial pressure of 3 mmHg. FINDINGS  Left Ventricle: Left ventricular ejection fraction, by estimation, is 65 to 70%. The left ventricle has normal function. The left ventricle has no regional wall motion abnormalities. The left ventricular internal cavity size was normal in size. There is  no left ventricular hypertrophy. Left ventricular diastolic parameters are consistent with Grade I diastolic dysfunction (impaired relaxation). Right Ventricle: The right ventricular size is normal. No increase in right ventricular wall thickness. Right ventricular systolic function is normal. Left Atrium: Left atrial size was normal in size. Right Atrium: Right atrial size was normal in size. Pericardium: There is no evidence of pericardial effusion. Mitral Valve: The mitral valve is normal in structure. Trivial mitral valve regurgitation. No evidence of mitral valve stenosis. MV peak gradient, 5.3 mmHg. The mean mitral valve gradient is 3.0 mmHg. Tricuspid Valve: The tricuspid valve is normal in structure. Tricuspid valve regurgitation is trivial. No evidence of tricuspid stenosis. Aortic Valve: The aortic valve is normal in structure. Aortic valve regurgitation is not visualized. No aortic stenosis is present. Aortic valve mean gradient measures 4.0 mmHg. Aortic valve peak gradient measures 8.0 mmHg. Aortic valve area, by VTI measures 2.38 cm. Pulmonic Valve: The pulmonic valve was normal in structure. Pulmonic valve regurgitation is not visualized. No evidence of pulmonic stenosis. Aorta: The aortic root is normal in size and structure. Venous: The inferior vena cava is  normal in size with greater than 50% respiratory variability, suggesting right atrial pressure of 3 mmHg. IAS/Shunts: No atrial level shunt detected by color flow Doppler.  LEFT VENTRICLE PLAX 2D LVIDd:         4.71 cm   Diastology LVIDs:         2.71 cm   LV e' medial:    6.20 cm/s LV PW:         1.32 cm   LV E/e' medial:  13.7 LV IVS:        0.94 cm   LV e' lateral:   7.83 cm/s LVOT diam:     2.30 cm   LV E/e' lateral: 10.8 LV SV:         60 LV SV Index:   32 LVOT Area:     4.15 cm  RIGHT VENTRICLE RV Basal diam:  3.94 cm LEFT ATRIUM           Index LA diam:      3.50 cm 1.88 cm/m LA Vol (A4C): 29.4 ml 15.80 ml/m  AORTIC VALVE                    PULMONIC VALVE AV Area (Vmax):    2.73 cm     PV Vmax:       1.49 m/s AV Area (Vmean):   2.75 cm     PV Vmean:      92.600 cm/s AV Area (VTI):     2.38 cm     PV VTI:  0.214 m AV Vmax:           141.00 cm/s  PV Peak grad:  8.9 mmHg AV Vmean:          88.000 cm/s  PV Mean grad:  4.0 mmHg AV VTI:            0.253 m AV Peak Grad:      8.0 mmHg AV Mean Grad:      4.0 mmHg LVOT Vmax:         92.70 cm/s LVOT Vmean:        58.300 cm/s LVOT VTI:          0.145 m LVOT/AV VTI ratio: 0.57  AORTA Ao Root diam: 3.80 cm MITRAL VALVE MV Area (PHT): 4.15 cm    SHUNTS MV Area VTI:   2.80 cm    Systemic VTI:  0.14 m MV Peak grad:  5.3 mmHg    Systemic Diam: 2.30 cm MV Mean grad:  3.0 mmHg MV Vmax:       1.15 m/s MV Vmean:      76.3 cm/s MV Decel Time: 183 msec MV E velocity: 84.80 cm/s MV Blia Totman velocity: 98.50 cm/s MV E/Demba Nigh ratio:  0.86 Isaias Cowman MD Electronically signed by Isaias Cowman MD Signature Date/Time: 08/04/2021/1:45:14 PM    Final         Scheduled Meds:  cholecalciferol  1,000 Units Oral Daily   docusate sodium  100 mg Oral BID   enoxaparin (LOVENOX) injection  40 mg Subcutaneous Q24H   ferrous [id number] C-folic acid  1 capsule Oral BID PC   magnesium hydroxide  30 mL Oral QHS   magnesium oxide  400 mg Oral BID   multivitamin with  minerals  1 tablet Oral Daily   pantoprazole  40 mg Oral Daily   polyethylene glycol  17 g Oral Daily   potassium chloride  40 mEq Oral Once   Continuous Infusions:     LOS: 5 days    Time spent: over 30 min    Fayrene Helper, MD [facility]   To contact the attending provider between 7A-7P or the covering provider during after hours 7P-7A, please log into the web site [email or web address] and access using universal [place] password for that web site. If you do not have the password, please call the hospital operator.  08/05/2021, 6:23 PM

## 2021-08-06 LAB — COMPREHENSIVE METABOLIC PANEL
ALT: 93 U/L — ABNORMAL HIGH (ref 0–44)
AST: 98 U/L — ABNORMAL HIGH (ref 15–41)
Albumin: 2.2 g/dL — ABNORMAL LOW (ref 3.5–5.0)
Alkaline Phosphatase: 136 U/L — ABNORMAL HIGH (ref 38–126)
Anion gap: 6 (ref 5–15)
BUN: 18 mg/dL (ref 8–23)
CO2: 26 mmol/L (ref 22–32)
Calcium: 8 mg/dL — ABNORMAL LOW (ref 8.9–10.3)
Chloride: 107 mmol/L (ref 98–111)
Creatinine, Ser: 0.93 mg/dL (ref 0.61–1.24)
GFR, Estimated: >60 mL/min (ref >60)
Glucose, Bld: 100 mg/dL — ABNORMAL HIGH (ref 70–99)
Potassium: 3.7 mmol/L (ref 3.5–5.1)
Sodium: 139 mmol/L (ref 135–145)
Total Bilirubin: 1.2 mg/dL (ref 0.3–1.2)
Total Protein: 4.9 g/dL — ABNORMAL LOW (ref 6.5–8.1)

## 2021-08-06 LAB — TYPE AND SCREEN
ABO/RH(D): O POS
Antibody Screen: NEGATIVE
Unit division: 0

## 2021-08-06 LAB — CBC WITH DIFFERENTIAL/PLATELET
Abs Immature Granulocytes: 0.05 10*3/uL (ref 0.00–0.07)
Basophils Absolute: 0 10*3/uL (ref 0.0–0.1)
Basophils Relative: 1 %
Eosinophils Absolute: 0.5 10*3/uL (ref 0.0–0.5)
Eosinophils Relative: 8 %
HCT: 25.3 % — ABNORMAL LOW (ref 39.0–52.0)
Hemoglobin: 8.7 g/dL — ABNORMAL LOW (ref 13.0–17.0)
Immature Granulocytes: 1 %
Lymphocytes Relative: 18 %
Lymphs Abs: 1.1 10*3/uL (ref 0.7–4.0)
MCH: 30.6 pg (ref 26.0–34.0)
MCHC: 34.4 g/dL (ref 30.0–36.0)
MCV: 89.1 fL (ref 80.0–100.0)
Monocytes Absolute: 0.8 10*3/uL (ref 0.1–1.0)
Monocytes Relative: 12 %
Neutro Abs: 3.7 10*3/uL (ref 1.7–7.7)
Neutrophils Relative %: 60 %
Platelets: 194 10*3/uL (ref 150–400)
RBC: 2.84 MIL/uL — ABNORMAL LOW (ref 4.22–5.81)
RDW: 14.5 % (ref 11.5–15.5)
WBC: 6.2 10*3/uL (ref 4.0–10.5)
nRBC: 0 % (ref 0.0–0.2)

## 2021-08-06 LAB — RESP PANEL BY RT-PCR (FLU A&B, COVID) ARPGX2
Influenza A by PCR: NEGATIVE
Influenza B by PCR: NEGATIVE
SARS Coronavirus 2 by RT PCR: NEGATIVE

## 2021-08-06 LAB — GLUCOSE, CAPILLARY
Glucose-Capillary: 95 mg/dL (ref 70–99)
Glucose-Capillary: 109 mg/dL — ABNORMAL HIGH (ref 70–99)

## 2021-08-06 LAB — MAGNESIUM: Magnesium: 1.7 mg/dL (ref 1.7–2.4)

## 2021-08-06 LAB — BPAM RBC
Blood Product Expiration Date: 202301102359
ISSUE DATE / TIME: 202212201100
Unit Type and Rh: 5100

## 2021-08-06 LAB — SURGICAL PATHOLOGY

## 2021-08-06 LAB — PHOSPHORUS: Phosphorus: 3.3 mg/dL (ref 2.5–4.6)

## 2021-08-06 MED ORDER — POLYETHYLENE GLYCOL 3350 17 G PO PACK
17.0000 g | PACK | Freq: Every day | ORAL | 0 refills | Status: DC
Start: 2021-08-07 — End: 2022-04-28

## 2021-08-06 MED ORDER — PANTOPRAZOLE SODIUM 40 MG PO TBEC
40.0000 mg | DELAYED_RELEASE_TABLET | Freq: Every day | ORAL | Status: DC
Start: 2021-08-07 — End: 2022-04-28

## 2021-08-06 MED ORDER — MAGNESIUM OXIDE -MG SUPPLEMENT 400 (240 MG) MG PO TABS: 400.0000 mg | ORAL_TABLET | Freq: Two times a day (BID) | ORAL | Status: AC

## 2021-08-06 MED ORDER — FE FUMARATE-B12-VIT C-FA-IFC PO CAPS: 1.0000 | ORAL_CAPSULE | Freq: Two times a day (BID) | ORAL | Status: DC

## 2021-08-06 NOTE — TOC Progression Note (Signed)
Transition of Care [facility]) - Progression Note    Patient Details  Name: Jared Castro MRN: [id number] Date of Birth: [birth year]  Transition of Care [facility]) CM/SW [place]on [place], RN Phone Number: 08/06/2021, 10:28 AM  Clinical Narrative:   The patient plans to DC home to [facility], He will have their PT see him at home with outpatient orders Has DME already set up         Expected Discharge Plan and Services                                                 Social Determinants of Health (SDOH) Interventions    Readmission Risk Interventions No flowsheet data found.

## 2021-08-06 NOTE — Progress Notes (Signed)
° °  Subjective: 5 Days Post-Op Procedure(s) (LRB): TOTAL HIP ARTHROPLASTY ANTERIOR APPROACH (Right) Patient reports pain as mild.   Patient is well, and has had no acute complaints or problems Denies any CP, SOB, ABD pain. We will continue therapy today.  Plan is to go Skilled nursing facility after hospital stay.  Objective: Vital signs in last 24 hours: Temp:  [97.3 F (36.3 C)-98.4 F (36.9 C)] 97.9 F (36.6 C) (12/21 0549) Pulse Rate:  [72-93] 72 (12/21 0549) Resp:  [16-22] 17 (12/21 0549) BP: (105-135)/(53-61) 135/61 (12/21 0549) SpO2:  [95 %-99 %] 99 % (12/21 0549)  Intake/Output from previous day: 12/20 0701 - 12/21 0700 In: 446 [I.V.:62; Blood:384] Out: 500 [Urine:350; Stool:150] Intake/Output this shift: Total I/O In: -  Out: 100 [Stool:100]  Recent Labs    08/04/21 0408 08/04/21 1213 08/05/21 0507 08/05/21 1830 08/06/21 0441  HGB 7.6* 8.3* 7.3* 8.7* 8.7*   Recent Labs    08/05/21 1830 08/06/21 0441  WBC 7.0 6.2  RBC 2.89* 2.84*  HCT 25.6* 25.3*  PLT 195 194   Recent Labs    08/05/21 0507 08/06/21 0441  NA 137 139  K 3.3* 3.7  CL 106 107  CO2 26 26  BUN 16 18  CREATININE 0.84 0.93  GLUCOSE 96 100*  CALCIUM 7.7* 8.0*   No results for input(s): LABPT, INR in the last 72 hours.  EXAM General - Patient is Alert, Appropriate, and Oriented Extremity - Neurovascular intact Sensation intact distally Intact pulses distally Dorsiflexion/Plantar flexion intact No cellulitis present Compartment soft Dressing - dressing C/D/I and no drainage, Prevena intact without drainage Motor Function - intact, moving foot and toes well on exam.   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   Weight as of this encounter: 72.7 kg. Advance diet Up with therapy Acute postop blood loss anemia-hemoglobin 8.7.  Continue with iron supplement. Pain well controlled Care management to assist with discharge to skilled nursing facility    Please remove provena negative pressure dressing on 08/13/2021 and apply honey comb dressing. Keep dressing clean and dry at all times. Follow-up with [facility] orthopedics in 2 weeks Lovenox 40 mg subcu daily x14 days at discharge  DVT Prophylaxis - Lovenox, Foot Pumps, and TED hose Weight-Bearing as tolerated to right leg   T. Rachelle Hora, PA-C [place] 08/06/2021, 8:14 AM

## 2021-08-06 NOTE — Progress Notes (Signed)
Physical Therapy Treatment Patient Details Name: Jared Castro MRN: [id number] DOB: [birth year] Today's Date: 08/06/2021   History of Present Illness Pt is an 87 y/o M admitted on 07/31/21 with c/c of fall & R hip pain. Imaging found acute displaced R femoral neck fx & pt underwent THA, anterior approach, by Dr. Rudene Christians on 08/01/21. PMH: HTN, paroxysmal SVT, nonischemic dilated cardiomyopathy, a-flutter, biventricular ICD in place, CAD, HLD, ileostomy    PT Comments    Patient received in recliner, agreeable to PT session. Reports he should be going home this afternoon. Patient is able to stand with cues and supervision. Ambulated 150 feet with RW and min guard/supervision. Cues for posture. He is progressing well toward goals and will continue to benefit from skilled PT for improved safety with mobility.      Recommendations for follow up therapy are one component of a multi-disciplinary discharge planning process, led by the attending physician.  Recommendations may be updated based on patient status, additional functional criteria and insurance authorization.  Follow Up Recommendations  Home health PT     Assistance Recommended at Discharge Intermittent Supervision/Assistance  Equipment Recommendations  Rolling walker (2 wheels);BSC/3in1    Recommendations for Other Services       Precautions / Restrictions Precautions Precautions: Fall Precaution Comments: direct anterior approach Restrictions Weight Bearing Restrictions: Yes RLE Weight Bearing: Weight bearing as tolerated     Mobility  Bed Mobility Overal bed mobility: Modified Independent Bed Mobility: Supine to Sit     Supine to sit: Modified independent (Device/Increase time);HOB elevated     General bed mobility comments: seated in recliner chair & remained in recliner at end of session    Transfers Overall transfer level: Needs assistance Equipment used: Rolling walker (2 wheels) Transfers: Sit to/from  Stand Sit to Stand: Supervision                Ambulation/Gait Ambulation/Gait assistance: Min guard Gait Distance (Feet): 150 Feet Assistive device: Rolling walker (2 wheels) Gait Pattern/deviations: Decreased step length - right;Decreased step length - left;Decreased stride length;Decreased dorsiflexion - right;Decreased dorsiflexion - left;Decreased weight shift to right;Trunk flexed Gait velocity: Decreased gait speed but increases as he continues to walk.     General Gait Details: cues needed for upright posture   Stairs             Wheelchair Mobility    Modified Rankin (Stroke Patients Only)       Balance Overall balance assessment: Modified Independent;Needs assistance Sitting-balance support: Feet supported Sitting balance-Leahy Scale: Good     Standing balance support: Bilateral upper extremity supported;During functional activity;Reliant on assistive device for balance Standing balance-Leahy Scale: Good Standing balance comment: BUE support on RW                            Cognition Arousal/Alertness: Awake/alert Behavior During Therapy: WFL for tasks assessed/performed Overall Cognitive Status: Within Functional Limits for tasks assessed                                          Exercises Total Joint Exercises Ankle Circles/Pumps: AROM;10 reps;Both Quad Sets: Both;10 reps;AROM Gluteal Sets: AROM;Both;10 reps Towel Squeeze: AROM;Both;10 reps Short Arc Quad: AROM;Right;10 reps Heel Slides: AROM;Right;10 reps Hip ABduction/ADduction: AAROM;Right;10 reps Straight Leg Raises: AAROM;Right;10 reps Long Arc Quad: AAROM;Strengthening;Right;Seated;10 reps    General Comments  Pertinent Vitals/Pain Pain Assessment: Faces Faces Pain Scale: Hurts a little bit Pain Location: R hip with activity Pain Descriptors / Indicators: Discomfort Pain Intervention(s): Monitored during session;Repositioned;Limited activity  within patient's tolerance    Home Living                          Prior Function            PT Goals (current goals can now be found in the care plan section) Acute Rehab PT Goals Patient Stated Goal: get better, go home PT Goal Formulation: With patient/family Time For Goal Achievement: 08/16/21 Potential to Achieve Goals: Good Progress towards PT goals: Progressing toward goals    Frequency    BID      PT Plan Current plan remains appropriate    Co-evaluation              AM-PAC PT "6 Clicks" Mobility   Outcome Measure  Help needed turning from your back to your side while in a flat bed without using bedrails?: None Help needed moving from lying on your back to sitting on the side of a flat bed without using bedrails?: None Help needed moving to and from a bed to a chair (including a wheelchair)?: A Little Help needed standing up from a chair using your arms (e.g., wheelchair or bedside chair)?: None Help needed to walk in hospital room?: A Little Help needed climbing 3-5 steps with a railing? : A Little 6 Click Score: 21    End of Session Equipment Utilized During Treatment: Gait belt Activity Tolerance: Patient tolerated treatment well Patient left: in chair;with call bell/phone within reach;with chair alarm set Nurse Communication: Mobility status PT Visit Diagnosis: Muscle weakness (generalized) (M62.81);Difficulty in walking, not elsewhere classified (R26.2);Other abnormalities of gait and mobility (R26.89);Pain Pain - Right/Left: Right Pain - part of body: Hip     Time: 6659-9357 PT Time Calculation (min) (ACUTE ONLY): 28 min  Charges:  $Gait Training: 8-22 mins $Therapeutic Exercise: 8-22 mins                     [employer], PT, GCS 08/06/21,2:20 PM

## 2021-08-06 NOTE — Progress Notes (Signed)
Physical Therapy Treatment Patient Details Name: Jared Castro MRN: [id number] DOB: Jun 19, 1933 Today's Date: 08/06/2021   History of Present Illness Pt is an 87 y/o M admitted on 07/31/21 with c/c of fall & R hip pain. Imaging found acute displaced R femoral neck fx & pt underwent THA, anterior approach, by Dr. Rudene Christians on 08/01/21. PMH: HTN, paroxysmal SVT, nonischemic dilated cardiomyopathy, a-flutter, biventricular ICD in place, CAD, HLD, ileostomy    PT Comments    Patient received in bed, sleeping but easily rouses. He is agreeable to PT session. Patient requesting ostomy bag to be emptied prior to session. He is able to perform bed mobility without assist. Transfers with mod independence. Patient ambulated 200 feet with RW and min guard. No lob. Cues for upright posture. Patient making good progress toward goals and will benefit from continued skilled PT while here to improve strength, safety and independence.       Recommendations for follow up therapy are one component of a multi-disciplinary discharge planning process, led by the attending physician.  Recommendations may be updated based on patient status, additional functional criteria and insurance authorization.  Follow Up Recommendations  Home health PT     Assistance Recommended at Discharge Intermittent Supervision/Assistance  Equipment Recommendations  Rolling walker (2 wheels);BSC/3in1    Recommendations for Other Services       Precautions / Restrictions Precautions Precautions: Fall Precaution Comments: anterior approach Restrictions Weight Bearing Restrictions: Yes RLE Weight Bearing: Weight bearing as tolerated     Mobility  Bed Mobility Overal bed mobility: Modified Independent Bed Mobility: Supine to Sit     Supine to sit: Modified independent (Device/Increase time);HOB elevated     General bed mobility comments: Patient able to come to sitting without physical assist, but did need bed rail this  morning    Transfers Overall transfer level: Modified independent Equipment used: Rolling walker (2 wheels) Transfers: Sit to/from Stand Sit to Stand: Modified independent (Device/Increase time)                Ambulation/Gait Ambulation/Gait assistance: Min guard Gait Distance (Feet): 200 Feet Assistive device: Rolling walker (2 wheels) Gait Pattern/deviations: Decreased step length - right;Decreased step length - left;Decreased stride length;Decreased dorsiflexion - right;Decreased dorsiflexion - left;Decreased weight shift to right;Trunk flexed Gait velocity: Decreased gait speed but increases as he continues to walk.     General Gait Details: cues needed for upright posture   Stairs             Wheelchair Mobility    Modified Rankin (Stroke Patients Only)       Balance Overall balance assessment: Needs assistance Sitting-balance support: Feet supported Sitting balance-Leahy Scale: Good     Standing balance support: Bilateral upper extremity supported;During functional activity;Reliant on assistive device for balance Standing balance-Leahy Scale: Fair Standing balance comment: BUE support on RW during gait, min guard for safety                            Cognition Arousal/Alertness: Awake/alert Behavior During Therapy: WFL for tasks assessed/performed Overall Cognitive Status: Within Functional Limits for tasks assessed                                          Exercises Total Joint Exercises Ankle Circles/Pumps: AROM;Both;10 reps Quad Sets: AROM;Both;10 reps Gluteal Sets: AROM;Both;10 reps Towel Squeeze: AROM;Both;10  reps Short Arc Quad: AROM;Right;10 reps Heel Slides: AROM;Right;10 reps Hip ABduction/ADduction: AAROM;Right;10 reps Straight Leg Raises: AAROM;Right;10 reps    General Comments        Pertinent Vitals/Pain Pain Assessment: Faces Faces Pain Scale: Hurts little more Pain Location: R hip with  activity Pain Descriptors / Indicators: Discomfort Pain Intervention(s): Monitored during session;Limited activity within patient's tolerance;Repositioned    Home Living                          Prior Function            PT Goals (current goals can now be found in the care plan section) Acute Rehab PT Goals Patient Stated Goal: get better, go home PT Goal Formulation: With patient Time For Goal Achievement: 08/16/21 Potential to Achieve Goals: Good Progress towards PT goals: Progressing toward goals    Frequency    BID      PT Plan Current plan remains appropriate    Co-evaluation              AM-PAC PT "6 Clicks" Mobility   Outcome Measure  Help needed turning from your back to your side while in a flat bed without using bedrails?: None Help needed moving from lying on your back to sitting on the side of a flat bed without using bedrails?: None Help needed moving to and from a bed to a chair (including a wheelchair)?: A Little Help needed standing up from a chair using your arms (e.g., wheelchair or bedside chair)?: None Help needed to walk in hospital room?: A Little Help needed climbing 3-5 steps with a railing? : A Little 6 Click Score: 21    End of Session Equipment Utilized During Treatment: Gait belt Activity Tolerance: Patient tolerated treatment well Patient left: in chair;with call bell/phone within reach;with chair alarm set Nurse Communication: Mobility status PT Visit Diagnosis: Muscle weakness (generalized) (M62.81);Difficulty in walking, not elsewhere classified (R26.2);Other abnormalities of gait and mobility (R26.89);Pain Pain - Right/Left: Right Pain - part of body: Hip     Time: 0936-1010 PT Time Calculation (min) (ACUTE ONLY): 34 min  Charges:  $Gait Training: 8-22 mins $Therapeutic Exercise: 8-22 mins                     Ahmya Bernick, PT, GCS 08/06/21,10:42 AM

## 2021-08-06 NOTE — Discharge Summary (Signed)
Physician Discharge Summary  Jared Castro XVQ:[id number] DOB: Oct 05, 1932 DOA: 07/31/2021  PCP: Adin Hector, MD  Admit date: 07/31/2021 Discharge date: 08/06/2021  Time spent: 36 minutes  Recommendations for Outpatient Follow-up:  Note discontinuation of several blood pressure meds including rate control with digoxin--will need discussion in the outpatient regarding reimplementation Requires outpatient follow-up with [facility] orthopedics in 2 weeks from discharge will discharge with negative pressure dressing to be removed 12/28 and apply honeycomb dressing-dressing to be kept clean and dry at all times Require CBC and Chem-12 in 1 week Lovenox on discharge for 14 days with TED hose   Discharge Diagnoses:  MAIN problem for hospitalization  Acute hip fracture  Please see below for itemized issues addressed in [place]- refer to other progress notes for clarity if needed  Discharge Condition: Improved  Diet recommendation: Heart healthy  Filed Weights   07/31/21 2059  Weight: 72.7 kg    History of present illness:  87 year old [place] resident white male known history of NICM EF 30-35%,  NSVT biventricular ICD CAD admitted 12/15 with hip fracture hip fracture was repaired he developed some mild delirium that improved significantly   Hospital Course:  Right femoral fracture [facility] performed anterior hip arthroplasty 12/16-see above recommendations with regards to wound check Will be discharging on limited amount of opiates as per them Hallucinations likely secondary to delirium Is completely resolved at this time-patient will discharge home as is better Hypotension on admission Nonischemic cardiomyopathy with improvement of EF EF 09-32% grade 1 diastolic dysfunction Probably secondary to both ACE inhibitors as well as ARB in addition to metoprolol All meds have been discontinued prior to discharge Patient is euvolemic on discharge Will follow with  Dr. Conni Elliot clinic Anemia of acute blood loss expected postop Nadir of hemoglobin 7.3 transfused 1 unit of blood up to 8.7 which is acceptable-outpatient follow-up Ulcerative colitis with ostomy placed 1980 Outpatient follow-up Skin cancer to scalp followed at [facility] and [facility] in the past Now off all topical medications etc.  Discharge Exam: Vitals:   08/06/21 0820 08/06/21 1124  BP: (!) 130/57 125/64  Pulse: 81 75  Resp: 20 18  Temp: 97.7 F (36.5 C) 97.9 F (36.6 C)  SpO2: 96% 98%    Subj on day of d/c   Awake coherent no distress -seems to be tolerating therapy and movement   General Exam on discharge  EOMI NCAT no focal deficit CTA B no rales no rhonchi S1-S2 no murmur seems to be in sinus Abdomen soft no rebound no guarding ROM intact No lower extremity edema Chest clear no added sound  neurologically intact and moving all 4 limbs equally without focal deficit  Discharge Instructions   Discharge Instructions     Diet - low sodium heart healthy   Complete by: As directed    Discharge wound care:   Complete by: As directed    As above   Increase activity slowly   Complete by: As directed       Allergies as of 08/06/2021   No Known Allergies      Medication List     STOP taking these medications    calcipotriene 0.005 % ointment Commonly known as: DOVONOX   desonide 0.05 % cream Commonly known as: DESOWEN   digoxin 0.25 MG tablet Commonly known as: LANOXIN   fluconazole 200 MG tablet Commonly known as: DIFLUCAN   fluocinonide ointment 0.05 % Commonly known as: LIDEX   Fluorouracil 5 % Soln  fluticasone 50 MCG/ACT nasal spray Commonly known as: FLONASE   ketoconazole 2 % cream Commonly known as: NIZORAL   lisinopril 5 MG tablet Commonly known as: ZESTRIL   losartan 25 MG tablet Commonly known as: COZAAR   Magnesium 250 MG Tabs Replaced by: magnesium oxide 400 (240 Mg) MG tablet   metoprolol succinate 25 MG 24 hr  tablet Commonly known as: TOPROL-XL   Metoprolol Succinate 25 MG Cs24   Misc. Devices Misc   pimecrolimus 1 % cream Commonly known as: ELIDEL   tretinoin 0.025 % cream Commonly known as: RETIN-A   triamcinolone cream 0.1 % Commonly known as: KENALOG       TAKE these medications    Advair HFA 115-21 MCG/ACT inhaler Generic drug: fluticasone-salmeterol Inhale into the lungs.   albuterol 108 (90 Base) MCG/ACT inhaler Commonly known as: VENTOLIN HFA Inhale into the lungs.   aspirin 81 MG EC tablet Take by mouth.   Cholecalciferol 25 MCG (1000 UT) tablet Take 1 tablet by mouth daily.   enoxaparin 40 MG/0.4ML injection Commonly known as: LOVENOX Inject 0.4 mLs (40 mg total) into the skin daily for 14 days.   EQ Probiotic Caps Take by mouth.   ferrous [id number] C-folic acid capsule Commonly known as: TRINSICON / FOLTRIN Take 1 capsule by mouth 2 (two) times daily after a meal.   magnesium oxide 400 (240 Mg) MG tablet Commonly known as: MAG-OX Take 1 tablet (400 mg total) by mouth 2 (two) times daily. Replaces: Magnesium 250 MG Tabs   minocycline 100 MG capsule Commonly known as: MINOCIN Take by mouth.   multivitamin tablet Take by mouth.   oxyCODONE 5 MG immediate release tablet Commonly known as: Oxy IR/ROXICODONE Take 1 tablet (5 mg total) by mouth every 6 (six) hours as needed for severe pain.   pantoprazole 40 MG tablet Commonly known as: PROTONIX Take 1 tablet (40 mg total) by mouth daily. Start taking on: August 07, 2021   polyethylene glycol 17 g packet Commonly known as: MIRALAX / GLYCOLAX Take 17 g by mouth daily. Start taking on: August 07, 2021   PRESERVISION AREDS 2 PO Take 1 capsule by mouth in the morning and at bedtime.               Durable Medical Equipment  (From admission, onward)           Start     Ordered   08/01/21 2144  DME Walker rolling  Once       Question Answer Comment  Walker: With Pyote Wheels   Patient needs a walker to treat with the following condition Status post total hip replacement, right      08/01/21 2143   08/01/21 2144  DME 3 n 1  Once        08/01/21 2143   08/01/21 2144  DME Bedside commode  Once       Question:  Patient needs a bedside commode to treat with the following condition  Answer:  Status post total hip replacement, right   08/01/21 2143              Discharge Care Instructions  (From admission, onward)           Start     Ordered   08/06/21 0000  Discharge wound care:       Comments: As above   08/06/21 1401           No Known Allergies  Contact  information for follow-up providers     Corey Skains, MD Follow up in 1 week(s).   Specialty: Cardiology Contact information: [address] [facility] [place] [address] [phone]         Duanne Guess, PA-C Follow up in 2 week(s).   Specialties: Orthopedic Surgery, Emergency Medicine Contact information: [place] [address] [phone]              Contact information for after-discharge care     Destination     [facility] .   Service: Skilled Nursing Contact information: [address] [place] [phone]                      The results of significant diagnostics from this hospitalization (including imaging, microbiology, ancillary and laboratory) are listed below for reference.    Significant Diagnostic Studies: DG Chest 1 View  Result Date: 07/31/2021 CLINICAL DATA:  Hip fracture EXAM: CHEST  1 VIEW COMPARISON:  None. FINDINGS: Left-sided pacing device. No focal opacity, pleural effusion or pneumothorax. Normal cardiac size. IMPRESSION: No active disease. Electronically Signed   By: Donavan Foil M.D.   On: 07/31/2021 18:05   DG C-Arm 1-60 Min-No Report  Result Date: 08/01/2021 Fluoroscopy was utilized by the  requesting physician.  No radiographic interpretation.   ECHOCARDIOGRAM COMPLETE  Result Date: 08/04/2021    ECHOCARDIOGRAM REPORT   Patient Name:   EAN GETTEL Date of Exam: 08/04/2021 Medical Rec #:  [id number]     Height:       68.0 in Accession #:    [id number]    Weight:       160.3 lb Date of Birth:  January 24, 1933     BSA:          1.860 m Patient Age:    56 years      BP:           121/61 mmHg Patient Gender: M             HR:           85 bpm. Exam Location:  [facility] Procedure: 2D Echo, Color Doppler and Cardiac Doppler Indications:     Hypotension  History:         Patient has no prior history of Echocardiogram examinations.                  Pacemaker; Risk Factors:Former Smoker.  Sonographer:     Charmayne Sheer Referring Phys:  Pratt Diagnosing Phys: Isaias Cowman MD IMPRESSIONS  1. Left ventricular ejection fraction, by estimation, is 65 to 70%. The left ventricle has normal function. The left ventricle has no regional wall motion abnormalities. Left ventricular diastolic parameters are consistent with Grade I diastolic dysfunction (impaired relaxation).  2. Right ventricular systolic function is normal. The right ventricular size is normal.  3. The mitral valve is normal in structure. Trivial mitral valve regurgitation. No evidence of mitral stenosis.  4. The aortic valve is normal in structure. Aortic valve regurgitation is not visualized. No aortic stenosis is present.  5. The inferior vena cava is normal in size with greater than 50% respiratory variability, suggesting right atrial pressure of 3 mmHg. FINDINGS  Left Ventricle: Left ventricular ejection fraction, by estimation, is 65 to 70%. The left ventricle has normal function. The left ventricle has no regional wall motion abnormalities. The left ventricular internal  cavity size was normal in size. There is  no left ventricular hypertrophy. Left ventricular diastolic parameters are consistent with Grade I diastolic  dysfunction (impaired relaxation). Right Ventricle: The right ventricular size is normal. No increase in right ventricular wall thickness. Right ventricular systolic function is normal. Left Atrium: Left atrial size was normal in size. Right Atrium: Right atrial size was normal in size. Pericardium: There is no evidence of pericardial effusion. Mitral Valve: The mitral valve is normal in structure. Trivial mitral valve regurgitation. No evidence of mitral valve stenosis. MV peak gradient, 5.3 mmHg. The mean mitral valve gradient is 3.0 mmHg. Tricuspid Valve: The tricuspid valve is normal in structure. Tricuspid valve regurgitation is trivial. No evidence of tricuspid stenosis. Aortic Valve: The aortic valve is normal in structure. Aortic valve regurgitation is not visualized. No aortic stenosis is present. Aortic valve mean gradient measures 4.0 mmHg. Aortic valve peak gradient measures 8.0 mmHg. Aortic valve area, by VTI measures 2.38 cm. Pulmonic Valve: The pulmonic valve was normal in structure. Pulmonic valve regurgitation is not visualized. No evidence of pulmonic stenosis. Aorta: The aortic root is normal in size and structure. Venous: The inferior vena cava is normal in size with greater than 50% respiratory variability, suggesting right atrial pressure of 3 mmHg. IAS/Shunts: No atrial level shunt detected by color flow Doppler.  LEFT VENTRICLE PLAX 2D LVIDd:         4.71 cm   Diastology LVIDs:         2.71 cm   LV e' medial:    6.20 cm/s LV PW:         1.32 cm   LV E/e' medial:  13.7 LV IVS:        0.94 cm   LV e' lateral:   7.83 cm/s LVOT diam:     2.30 cm   LV E/e' lateral: 10.8 LV SV:         60 LV SV Index:   32 LVOT Area:     4.15 cm  RIGHT VENTRICLE RV Basal diam:  3.94 cm LEFT ATRIUM           Index LA diam:      3.50 cm 1.88 cm/m LA Vol (A4C): 29.4 ml 15.80 ml/m  AORTIC VALVE                    PULMONIC VALVE AV Area (Vmax):    2.73 cm     PV Vmax:       1.49 m/s AV Area (Vmean):   2.75 cm      PV Vmean:      92.600 cm/s AV Area (VTI):     2.38 cm     PV VTI:        0.214 m AV Vmax:           141.00 cm/s  PV Peak grad:  8.9 mmHg AV Vmean:          88.000 cm/s  PV Mean grad:  4.0 mmHg AV VTI:            0.253 m AV Peak Grad:      8.0 mmHg AV Mean Grad:      4.0 mmHg LVOT Vmax:         92.70 cm/s LVOT Vmean:        58.300 cm/s LVOT VTI:          0.145 m LVOT/AV VTI ratio: 0.57  AORTA Ao Root diam: 3.80  cm MITRAL VALVE MV Area (PHT): 4.15 cm    SHUNTS MV Area VTI:   2.80 cm    Systemic VTI:  0.14 m MV Peak grad:  5.3 mmHg    Systemic Diam: 2.30 cm MV Mean grad:  3.0 mmHg MV Vmax:       1.15 m/s MV Vmean:      76.3 cm/s MV Decel Time: 183 msec MV E velocity: 84.80 cm/s MV A velocity: 98.50 cm/s MV E/A ratio:  0.86 Isaias Cowman MD Electronically signed by Isaias Cowman MD Signature Date/Time: 08/04/2021/1:45:14 PM    Final    DG HIP UNILAT WITH PELVIS 1V RIGHT  Result Date: 08/01/2021 CLINICAL DATA:  Hip replacement EXAM: DG HIP (WITH OR WITHOUT PELVIS) 1V RIGHT COMPARISON:  07/31/2021 FINDINGS: Four low resolution intraoperative spot views of the right hip. The images were obtained during operative course of right hip replacement. There is normal alignment IMPRESSION: Fluoroscopic assistance provided during right hip replacement. Electronically Signed   By: Donavan Foil M.D.   On: 08/01/2021 21:19   DG HIP UNILAT W OR W/O PELVIS 2-3 VIEWS RIGHT  Result Date: 08/01/2021 CLINICAL DATA:  Postop pain. EXAM: DG HIP (WITH OR WITHOUT PELVIS) 2-3V RIGHT COMPARISON:  None. FINDINGS: The patient is status post right hip replacement. Hardware is in good position. Skin staples are noted. No other acute abnormalities are identified. IMPRESSION: Status post right hip replacement. Hardware is in good position. No cause for pain identified. Electronically Signed   By: Dorise Bullion III M.D.   On: 08/01/2021 22:19   DG Hip Unilat  With Pelvis 2-3 Views Right  Result Date: 07/31/2021 CLINICAL  DATA:  Fall EXAM: DG HIP (WITH OR WITHOUT PELVIS) 2-3V RIGHT COMPARISON:  None. FINDINGS: Postsurgical changes over the pelvis. Multiple phleboliths. Pubic symphysis and rami appear intact. Acute right subcapital femoral neck fracture with cranial displacement of the trochanter. No femoral head dislocation. Probable right lower quadrant ostomy. IMPRESSION: Acute displaced right femoral neck fracture. Electronically Signed   By: Donavan Foil M.D.   On: 07/31/2021 18:05    Microbiology: Recent Results (from the past 240 hour(s))  Resp Panel by RT-PCR (Flu A&B, Covid) Nasopharyngeal Swab     Status: None   Collection Time: 07/31/21  6:17 PM   Specimen: Nasopharyngeal Swab; Nasopharyngeal(NP) swabs in vial transport medium  Result Value Ref Range Status   SARS Coronavirus 2 by RT PCR NEGATIVE NEGATIVE Final    Comment: (NOTE) SARS-CoV-2 target nucleic acids are NOT DETECTED.  The SARS-CoV-2 RNA is generally detectable in upper respiratory specimens during the acute phase of infection. The lowest concentration of SARS-CoV-2 viral copies this assay can detect is 138 copies/mL. A negative result does not preclude SARS-Cov-2 infection and should not be used as the sole basis for treatment or other patient management decisions. A negative result may occur with  improper specimen collection/handling, submission of specimen other than nasopharyngeal swab, presence of viral mutation(s) within the areas targeted by this assay, and inadequate number of viral copies(<138 copies/mL). A negative result must be combined with clinical observations, patient history, and epidemiological information. The expected result is Negative.  Fact Sheet for Patients:  EntrepreneurPulse.com.au  Fact Sheet for Healthcare Providers:  IncredibleEmployment.be  This test is no t yet approved or cleared by the [place] FDA and  has been authorized for detection and/or diagnosis of  SARS-CoV-2 by FDA under an Emergency Use Authorization (EUA). This EUA will remain  in effect (meaning this  test can be used) for the duration of the COVID-19 declaration under Section 564(b)(1) of the Act, 21 U.S.C.section 360bbb-3(b)(1), unless the authorization is terminated  or revoked sooner.       Influenza A by PCR NEGATIVE NEGATIVE Final   Influenza B by PCR NEGATIVE NEGATIVE Final    Comment: (NOTE) The Xpert Xpress SARS-CoV-2/FLU/RSV plus assay is intended as an aid in the diagnosis of influenza from Nasopharyngeal swab specimens and should not be used as a sole basis for treatment. Nasal washings and aspirates are unacceptable for Xpert Xpress SARS-CoV-2/FLU/RSV testing.  Fact Sheet for Patients: EntrepreneurPulse.com.au  Fact Sheet for Healthcare Providers: IncredibleEmployment.be  This test is not yet approved or cleared by the [place] FDA and has been authorized for detection and/or diagnosis of SARS-CoV-2 by FDA under an Emergency Use Authorization (EUA). This EUA will remain in effect (meaning this test can be used) for the duration of the COVID-19 declaration under Section 564(b)(1) of the Act, 21 U.S.C. section 360bbb-3(b)(1), unless the authorization is terminated or revoked.  Performed at [facility], [address] [place], [place] [address]      Labs: Basic Metabolic Panel: Recent Labs  Lab 08/02/21 0403 08/03/21 0411 08/04/21 0408 08/05/21 0507 08/06/21 0441  NA 137 134* 137 137 139  K 3.9 3.7 3.6 3.3* 3.7  CL 106 105 108 106 107  CO2 24 24 24 26 26   GLUCOSE 116* 122* 110* 96 100*  BUN 22 20 19 16 18   CREATININE 1.06 0.97 0.90 0.84 0.93  CALCIUM 7.8* 8.0* 7.7* 7.7* 8.0*  MG 2.0 1.9 1.7 1.7 1.7  PHOS 3.9 1.9* 2.7 3.3 3.3   Liver Function Tests: Recent Labs  Lab 08/02/21 0403 08/03/21 0411 08/04/21 0408 08/06/21 0441  AST 25 37 50* 98*  ALT 19 25 41 93*  ALKPHOS 48 51 65 136*   BILITOT 1.4* 1.0 0.8 1.2  PROT 5.3* 5.0* 4.7* 4.9*  ALBUMIN 2.9* 2.5* 2.3* 2.2*   No results for input(s): LIPASE, AMYLASE in the last 168 hours. No results for input(s): AMMONIA in the last 168 hours. CBC: Recent Labs  Lab 07/31/21 1816 08/01/21 0545 08/02/21 0403 08/02/21 1119 08/03/21 0411 08/04/21 0408 08/04/21 1213 08/05/21 0507 08/05/21 1830 08/06/21 0441  WBC 10.2   < > 10.1  --  8.3 6.9  --  6.1 7.0 6.2  NEUTROABS 8.6*  --  8.6*  --  6.2 4.5  --   --   --  3.7  HGB 12.3*   < > 10.1*   < > 8.5* 7.6* 8.3* 7.3* 8.7* 8.7*  HCT 37.3*   < > 30.5*   < > 25.8* 22.8* 25.0* 21.8* 25.6* 25.3*  MCV 92.3   < > 91.6  --  90.2 90.5  --  90.8 88.6 89.1  PLT 205   < > 151  --  140* 138*  --  170 195 194   < > = values in this interval not displayed.   Cardiac Enzymes: No results for input(s): CKTOTAL, CKMB, CKMBINDEX, TROPONINI in the last 168 hours. BNP: BNP (last 3 results) No results for input(s): BNP in the last 8760 hours.  ProBNP (last 3 results) No results for input(s): PROBNP in the last 8760 hours.  CBG: Recent Labs  Lab 08/05/21 1145 08/05/21 1648 08/05/21 2011 08/06/21 0821 08/06/21 1125  GLUCAP 105* 110* 136* 95 109*       Signed:  Nita Sells MD   [facility] 08/06/2021, 2:01  PM

## 2021-08-06 NOTE — Progress Notes (Signed)
Occupational Therapy Treatment Patient Details Name: Jared Castro MRN: [id number] DOB: Nov 29, 1932 Today's Date: 08/06/2021   History of present illness Pt is an 87 y/o M admitted on 07/31/21 with c/c of fall & R hip pain. Imaging found acute displaced R femoral neck fx & pt underwent THA, anterior approach, by Dr. Rudene Christians on 08/01/21. PMH: HTN, paroxysmal SVT, nonischemic dilated cardiomyopathy, a-flutter, biventricular ICD in place, CAD, HLD, ileostomy   OT comments  Upon entering the room, pt seated in recliner chair and pt reports, " I am so tired from my walking". Pt is agreeable to OT intervention this session. Pt standing from recliner chair with supervision and min cuing for hand placement. Pt turning to counter and locating toothbrush from bag. Pt ambulating with sink to bathroom to brush teeth with close supervision. Pt standing for ~ 8 minutes thoroughly brushing teeth without physical assistance. Pt does have small LOB when taking step back but catches self. He reports that he has a fear of falling because his wife is unable to assist him at home. Pt ambulating back to recliner chair at end of session in same manner. OT reviewed lower body dressing techniques again in order to increase independence in self care tasks.    Recommendations for follow up therapy are one component of a multi-disciplinary discharge planning process, led by the attending physician.  Recommendations may be updated based on patient status, additional functional criteria and insurance authorization.    Follow Up Recommendations  Home health OT    Assistance Recommended at Discharge Intermittent Supervision/Assistance  Equipment Recommendations  BSC/3in1       Precautions / Restrictions Precautions Precautions: Fall Precaution Comments: direct anterior approach Restrictions Weight Bearing Restrictions: Yes RLE Weight Bearing: Weight bearing as tolerated       Mobility Bed Mobility Overal bed mobility:  Modified Independent Bed Mobility: Supine to Sit     Supine to sit: Modified independent (Device/Increase time);HOB elevated     General bed mobility comments: seated in recliner chair    Transfers Overall transfer level: Needs assistance Equipment used: Rolling walker (2 wheels) Transfers: Sit to/from Stand Sit to Stand: Supervision                 Balance Overall balance assessment: Needs assistance Sitting-balance support: Feet supported Sitting balance-Leahy Scale: Good     Standing balance support: Bilateral upper extremity supported;During functional activity;Reliant on assistive device for balance Standing balance-Leahy Scale: Fair Standing balance comment: BUE support on RW                           ADL either performed or assessed with clinical judgement   ADL Overall ADL's : Needs assistance/impaired     Grooming: Wash/dry hands;Standing;Supervision/safety;Wash/dry face;Oral care                                      Extremity/Trunk Assessment Upper Extremity Assessment Upper Extremity Assessment: Generalized weakness   Lower Extremity Assessment Lower Extremity Assessment: Generalized weakness        Vision Patient Visual Report: No change from baseline            Cognition Arousal/Alertness: Awake/alert Behavior During Therapy: WFL for tasks assessed/performed Overall Cognitive Status: Within Functional Limits for tasks assessed  Exercises Total Joint Exercises Ankle Circles/Pumps: AROM;Both;10 reps Quad Sets: AROM;Both;10 reps Gluteal Sets: AROM;Both;10 reps Towel Squeeze: AROM;Both;10 reps Short Arc Quad: AROM;Right;10 reps Heel Slides: AROM;Right;10 reps Hip ABduction/ADduction: AAROM;Right;10 reps Straight Leg Raises: AAROM;Right;10 reps           Pertinent Vitals/ Pain       Pain Assessment: Faces Faces Pain Scale: Hurts a little  bit Pain Location: R hip with activity Pain Descriptors / Indicators: Discomfort Pain Intervention(s): Limited activity within patient's tolerance;Monitored during session;Repositioned         Frequency  Min 2X/week        Progress Toward Goals  OT Goals(current goals can now be found in the care plan section)  Progress towards OT goals: Progressing toward goals  Acute Rehab OT Goals Patient Stated Goal: to be able to take care of himself OT Goal Formulation: With patient Time For Goal Achievement: 08/16/21 Potential to Achieve Goals: Good  Plan Discharge plan remains appropriate;Frequency remains appropriate       AM-PAC OT "6 Clicks" Daily Activity     Outcome Measure   Help from another person eating meals?: None Help from another person taking care of personal grooming?: A Little Help from another person toileting, which includes using toliet, bedpan, or urinal?: A Little Help from another person bathing (including washing, rinsing, drying)?: A Little Help from another person to put on and taking off regular upper body clothing?: None Help from another person to put on and taking off regular lower body clothing?: A Little 6 Click Score: 20    End of Session Equipment Utilized During Treatment: Rolling walker (2 wheels)  OT Visit Diagnosis: Other abnormalities of gait and mobility (R26.89)   Activity Tolerance Patient tolerated treatment well   Patient Left in chair;with call bell/phone within reach;with chair alarm set   Nurse Communication Mobility status        Time: 1040-1103 OT Time Calculation (min): 23 min  Charges: OT General Charges $OT Visit: 1 Visit OT Treatments $Self Care/Home Management : 23-37 mins  Darleen Crocker, MS, OTR/L , CBIS ascom [phone]  08/06/21, 12:57 PM

## 2021-08-06 NOTE — Plan of Care (Signed)
°  Problem: Pain Management: Goal: Pain level will decrease Outcome: Progressing   Problem: Education: Goal: Knowledge of General Education information will improve Description: Including pain rating scale, medication(s)/side effects and non-pharmacologic comfort measures Outcome: Progressing   Problem: Health Behavior/Discharge Planning: Goal: Ability to manage health-related needs will improve Outcome: Progressing   Problem: Clinical Measurements: Goal: Will remain free from infection Outcome: Progressing   Problem: Clinical Measurements: Goal: Cardiovascular complication will be avoided Outcome: Progressing   Problem: Pain Managment: Goal: General experience of comfort will improve Outcome: Progressing   Problem: Safety: Goal: Ability to remain free from injury will improve Outcome: Progressing

## 2021-08-06 NOTE — Progress Notes (Signed)
Patient and spouse was given verbal and written discharge instructions, they acknowledge understanding and states they will comply, small wound vac was connected for travel, walker and bedside commode was sent with patient, nurse and technician transported patient to car, he tolerated all well.

## 2021-09-09 NOTE — Anesthesia Postprocedure Evaluation (Signed)
Anesthesia Post Note  Patient: Jared Castro  Procedure(s) Performed: TOTAL HIP ARTHROPLASTY ANTERIOR APPROACH (Right: Hip)  Patient location during evaluation: PACU Anesthesia Type: Spinal Level of consciousness: oriented and awake and alert Pain management: pain level controlled Vital Signs Assessment: post-procedure vital signs reviewed and stable Respiratory status: spontaneous breathing, respiratory function stable and patient connected to nasal cannula oxygen Cardiovascular status: blood pressure returned to baseline and stable Postop Assessment: no headache, no backache, no apparent nausea or vomiting and spinal receding Anesthetic complications: no   No notable events documented.   Last Vitals:  Vitals:   08/06/21 0820 08/06/21 1124  BP: (!) 130/57 125/64  Pulse: 81 75  Resp: 20 18  Temp: 36.5 C 36.6 C  SpO2: 96% 98%    Last Pain:  Vitals:   08/06/21 0800  TempSrc:   PainSc: 0-No pain                 Martha Clan

## 2021-09-10 ENCOUNTER — Other Ambulatory Visit (HOSPITAL_COMMUNITY): Payer: Self-pay | Admitting: Physical Medicine & Rehabilitation

## 2021-09-10 DIAGNOSIS — M542 Cervicalgia: Secondary | ICD-10-CM

## 2021-10-30 ENCOUNTER — Ambulatory Visit (HOSPITAL_COMMUNITY)
Admission: RE | Admit: 2021-10-30 | Discharge: 2021-10-30 | Disposition: A | Discharge status: Home / Self Care | Payer: Medicare HMO | Source: Ambulatory Visit | Attending: Physical Medicine & Rehabilitation | Admitting: Physical Medicine & Rehabilitation

## 2021-10-30 ENCOUNTER — Other Ambulatory Visit: Payer: Self-pay

## 2021-10-30 DIAGNOSIS — M542 Cervicalgia: Secondary | ICD-10-CM | POA: Diagnosis not present

## 2021-10-30 NOTE — Progress Notes (Signed)
Per order, changed device settings for MRI to  ?DOO at 90 bpm  ?MRI mode/Tachy-therapies to off  ?Will program device back to pre-MRI settings after completion of exam, and send transmission ?

## 2021-12-24 ENCOUNTER — Other Ambulatory Visit: Payer: Self-pay | Admitting: Physical Medicine & Rehabilitation

## 2021-12-24 DIAGNOSIS — M5412 Radiculopathy, cervical region: Secondary | ICD-10-CM

## 2021-12-31 ENCOUNTER — Ambulatory Visit
Admission: RE | Admit: 2021-12-31 | Discharge: 2021-12-31 | Disposition: A | Discharge status: Home / Self Care | Payer: Medicare HMO | Source: Ambulatory Visit | Attending: Physical Medicine & Rehabilitation | Admitting: Physical Medicine & Rehabilitation

## 2021-12-31 DIAGNOSIS — M5412 Radiculopathy, cervical region: Secondary | ICD-10-CM

## 2021-12-31 MED ORDER — TRIAMCINOLONE ACETONIDE 40 MG/ML IJ SUSP (RADIOLOGY)
60.0000 mg | Freq: Once | INTRAMUSCULAR | Status: AC
  Administered 2021-12-31: 60 mg via EPIDURAL

## 2021-12-31 MED ORDER — IOPAMIDOL (ISOVUE-M 300) INJECTION 61%
1.0000 mL | Freq: Once | INTRAMUSCULAR | Status: AC
  Administered 2021-12-31: 1 mL via EPIDURAL

## 2021-12-31 NOTE — Discharge Instructions (Signed)

## 2022-04-27 NOTE — Progress Notes (Unsigned)
Referring Physician:  No referring provider defined for this encounter.  Primary Physician:  Adin Hector, MD  History of Present Illness: 04/27/2022 Jared Castro is here today with a chief complaint of left-sided neck pain.  He has intermittent right-sided neck pain.  He has undergone a recent epidural steroid injection which helped him.  His pain is usually around a 3 out of 10.  It can be as bad as 8 out of 10.  Bending over, movement, and driving make it worse.  He has some limited rotation to the right.  He did physical therapy but it worsened his symptoms.   Bowel/Bladder Dysfunction: none  Conservative measures:  Physical therapy: participated at [facility] 01/2022-03/2022 Multimodal medical therapy including regular antiinflammatories:  meloxicam, tylenol, aleve Injections:  has received epidural steroid injections 12/31/21: C6-7 IL ESI (75% relief) 12/18/21: Left C3, C4 and C5 medial branch block (no significant relief during the anesthetic phase) 11/13/21: Left C4-5 TF ESI   Past Surgery: denies  Jared Castro has no symptoms of cervical myelopathy.  The symptoms are causing a significant impact on the patient's life.   Review of Systems:  A 10 point review of systems is negative, except for the pertinent positives and negatives detailed in the HPI.  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    Social History: Social History   Tobacco Use   Smoking status: Former    Types: Cigarettes   Smokeless tobacco: Never  Substance Use Topics   Alcohol use: Yes    Alcohol/week: 1.0 standard drink of alcohol    Types: 1 Glasses of wine per week   Drug use: Never    Family Medical History: Family History  Problem Relation Age of Onset   Hypertension Mother    Cancer Mother     Physical Examination: There were no vitals filed for this visit.  General: Patient is well developed, well nourished, calm, collected, and in no apparent distress. Attention to examination is appropriate.  Neck:   Supple.  Limited range of motion on rotation to the right  Respiratory: Patient is breathing without any difficulty.   NEUROLOGICAL:     Awake, alert, oriented to person, place, and time.  Speech is clear and fluent. Fund of knowledge is appropriate.   Cranial Nerves: Pupils equal round and reactive to light.  Facial tone is symmetric.  Facial sensation is symmetric. Shoulder shrug is symmetric. Tongue protrusion is midline.  There is no pronator drift.  ROM of spine: full.    Strength: Side Biceps Triceps Deltoid Interossei Grip Wrist Ext. Wrist Flex.  R '5 5 5 5 5 5 5  '$ L '5 5 5 5 5 5 5   '$ Side  Iliopsoas Quads Hamstring PF DF EHL  R '5 5 5 5 5 5  '$ L '5 5 5 5 5 5   '$ Reflexes are 1+ and symmetric at the biceps, triceps, brachioradialis, patella and achilles.   Hoffman's is absent.  Clonus is not present.  Toes are down-going.  Bilateral upper and lower extremity sensation is intact to light touch.    No evidence of dysmetria noted.  Gait is normal.    Medical Decision Making  Imaging: MRI C spine 10/31/21 IMPRESSION: 1. At C3-4 there is a mild broad-based disc bulge. Mild bilateral facet arthropathy. Moderate bilateral foraminal  stenosis. 2. At C4-5 there is a broad-based disc bulge. Moderate bilateral facet arthropathy. Bilateral uncovertebral degenerative changes. Moderate bilateral foraminal stenosis. Mild spinal stenosis. 3. At C5-6 there is a broad-based disc osteophyte complex. Bilateral uncovertebral degenerative changes. Moderate right and mild left foraminal stenosis.     Electronically Signed   By: Kathreen Devoid M.D.   On: 10/31/2021 11:12  I have personally reviewed the images and agree with the above interpretation.  Assessment and Plan: Jared Castro is a pleasant 88 y.o. male with neck pain without obvious radiographic correlate.  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  I recommended that he continue exercises, NSAIDs, and epidural steroid injections.  He is also a candidate for trigger point injections.  I would not recommend surgical intervention.  I will see him back on an as needed basis.   I spent a total of 30 minutes in face-to-face and non-face-to-face activities related to this patient's care today.  Thank you for involving me in the care of this patient.      Sonali Wivell K. Izora Ribas MD, [facility]

## 2022-04-28 ENCOUNTER — Ambulatory Visit (INDEPENDENT_AMBULATORY_CARE_PROVIDER_SITE_OTHER): Payer: Medicare HMO | Admitting: Neurosurgery

## 2022-04-28 ENCOUNTER — Encounter: Payer: Self-pay | Admitting: Neurosurgery

## 2022-04-28 VITALS — BP 146/69 | HR 65 | Ht 68.0 in | Wt 157.6 lb

## 2022-04-28 DIAGNOSIS — M542 Cervicalgia: Secondary | ICD-10-CM

## 2022-05-07 ENCOUNTER — Other Ambulatory Visit: Payer: Self-pay | Admitting: Family Medicine

## 2022-05-07 DIAGNOSIS — M5412 Radiculopathy, cervical region: Secondary | ICD-10-CM

## 2022-05-26 ENCOUNTER — Ambulatory Visit
Admission: RE | Admit: 2022-05-26 | Discharge: 2022-05-26 | Disposition: A | Discharge status: Home / Self Care | Payer: Medicare HMO | Source: Ambulatory Visit | Attending: Family Medicine | Admitting: Family Medicine

## 2022-05-26 DIAGNOSIS — M5412 Radiculopathy, cervical region: Secondary | ICD-10-CM

## 2022-05-26 MED ORDER — TRIAMCINOLONE ACETONIDE 40 MG/ML IJ SUSP (RADIOLOGY)
60.0000 mg | Freq: Once | INTRAMUSCULAR | Status: AC
  Administered 2022-05-26: 60 mg via EPIDURAL

## 2022-05-26 MED ORDER — IOPAMIDOL (ISOVUE-M 300) INJECTION 61%
1.0000 mL | Freq: Once | INTRAMUSCULAR | Status: AC
  Administered 2022-05-26: 1 mL via EPIDURAL

## 2022-05-26 NOTE — Discharge Instructions (Signed)

## 2022-08-26 ENCOUNTER — Encounter: Payer: Self-pay | Admitting: Urology

## 2022-08-26 ENCOUNTER — Ambulatory Visit: Payer: Medicare HMO | Admitting: Urology

## 2022-08-26 ENCOUNTER — Ambulatory Visit: Payer: Self-pay | Admitting: Urology

## 2022-08-26 VITALS — BP 155/80 | HR 79 | Ht 69.0 in | Wt 150.0 lb

## 2022-08-26 DIAGNOSIS — R399 Unspecified symptoms and signs involving the genitourinary system: Secondary | ICD-10-CM | POA: Diagnosis not present

## 2022-08-26 DIAGNOSIS — R972 Elevated prostate specific antigen [PSA]: Secondary | ICD-10-CM | POA: Diagnosis not present

## 2022-08-26 NOTE — Progress Notes (Signed)
08/26/2022 11:34 AM   Jared Castro [birth year] [id number]  Referring provider: Adin Hector, MD [address] [place] [facility] [place],  [place] [address]  Chief Complaint  Patient presents with   Elevated PSA    HPI: Jared Castro is a 89 y.o. male referred for evaluation of an elevated PSA and "bubbles in urine".  Previously followed for an elevated PSA by Dr. Maudie Mercury at [facility] and Dr. Tamala Julian at [facility] Requested to establish local urologic care due to driving distance. He initially saw Dr. Tamala Julian September 2022 for an elevated PSA of 20 Prior APR 1980 Summary of urologic history as follows 2007 PSA 1.52 2007 Cystoscopy for gross hematuria (presumably negative) 02/2008 Bladder stone noted with bladder neck obstruction, PSA 1.52 03/29/08 TURP, cystolithalopaxy, Holmium laser 10/19/2016 PSA 5.42, Cr 1.14 11/16/2016 PSA 5.74 04/21/2017 PSA 6.89 05/19/2017 PVR 50m 06/01/2017 Exosome Dx urine liquid biopsy 33.67% risk of high grade prostate cancer 05/03/2018 Prostate MRI - PIRADS 4 lesion anterior transition zone mid-gland 08/04/2018 [facility] Lack of rectum challenging 11/14/19 Bone scan - increased uptake at left greater trochanter 01/22/21 PSA 20.59 03/19/21 PSA 16 [facility]  Volume was 30.2 mL and no lesions were identified that were suspicious for high-grade prostate cancer It was Dr. SThompson Caulrecommendation that based on current prostate cancer screening guidelines, negative MRI and difficulty/risks of prostate biopsy with previous APR that no biopsy be performed and to discontinue further PSA screening.  The patient was in agreement with this plan He also wanted to discuss "increased bubbles in his urine".  He notes the bubbles after the urine stream hits the water and he does not have pneumaturia.  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   Stasis dermatitis    Tinea pedis    Ulcerative colitis ([facility]     Surgical History: Past Surgical History:  Procedure Laterality Date   APPENDECTOMY     biventricular ICD  03/09/2016   BLADDER STONE REMOVAL     CATARACT EXTRACTION Bilateral    Colectomy Total Abdominal     FUNCTIONAL ENDOSCOPIC SINUS SURGERY     ILEOSTOMY     PROSTATE SURGERY     prosthetic lens placement       skin cancer removal  11/04/2018   TONSILLECTOMY     TOTAL HIP ARTHROPLASTY Right 08/01/2021   Procedure: TOTAL HIP ARTHROPLASTY ANTERIOR APPROACH;  Surgeon: MHessie Knows MD;  Location: [facility] ORS;  Service: Orthopedics;  Laterality: Right;    Home Medications:  Allergies as of 08/26/2022   No Known Allergies      Medication List        Accurate as of August 26, 2022 11:34 AM. If you have any questions, ask your nurse or doctor.          Cholecalciferol 25 MCG (1000 UT)  tablet Take 1 tablet by mouth daily.   losartan 25 MG tablet Commonly known as: COZAAR Take 25 mg by mouth daily.   magnesium oxide 400 (240 Mg) MG tablet Commonly known as: MAG-OX Take 1 tablet (400 mg total) by mouth 2 (two) times daily.   meloxicam 7.5 MG tablet Commonly known as: MOBIC Take 1-2 tablets by mouth. every morning as needed   metoprolol succinate 25 MG 24 hr tablet Commonly known as: TOPROL-XL Take 25 mg by mouth 2 (two) times daily.   PRESERVISION AREDS 2 PO Take 1 capsule by mouth in the morning and at bedtime.        Allergies: No  Known Allergies  Family History: Family History  Problem Relation Age of Onset   Hypertension Mother    Cancer Mother     Social History:  reports that he has quit smoking. His smoking use included cigarettes. He has never used smokeless tobacco. He reports current alcohol use of about 1.0 standard drink of alcohol per week. He reports that he does not use drugs.   Physical Exam: BP (!) 155/80   Pulse 79   Ht '5\' 9"'$  (1.753 m)   Wt 150 lb (68 kg)   BMI 22.15 kg/m   Constitutional:  Alert and oriented, No acute distress. HEENT: [place] AT Respiratory: Normal respiratory effort, no increased work of breathing. Psychiatric: Normal mood and affect.  Laboratory Data:  Urinalysis Urinalysis 06/24/2022 was normal   Assessment & Plan:    1.  Elevated PSA Agree with Dr. Tamala Julian regarding no recommendation of biopsy and to discontinue PSA testing  2.  Lower urinary tract symptoms No history recurrent UTI and UA normal.  He does not have pneumaturia We discussed the bubbles he notes in his urine is not pathologic   Abbie Sons, MD  [facility] [address] [place], [place] [address] [phone]

## 2022-08-27 ENCOUNTER — Encounter: Payer: Self-pay | Admitting: Urology

## 2022-09-23 ENCOUNTER — Other Ambulatory Visit: Payer: Self-pay | Admitting: Physical Medicine & Rehabilitation

## 2022-09-23 DIAGNOSIS — M5010 Cervical disc disorder with radiculopathy, unspecified cervical region: Secondary | ICD-10-CM

## 2022-10-06 ENCOUNTER — Other Ambulatory Visit: Payer: Medicare HMO

## 2022-10-08 ENCOUNTER — Ambulatory Visit
Admission: RE | Admit: 2022-10-08 | Discharge: 2022-10-08 | Disposition: A | Discharge status: Home / Self Care | Payer: Medicare HMO | Source: Ambulatory Visit | Attending: Physical Medicine & Rehabilitation | Admitting: Physical Medicine & Rehabilitation

## 2022-10-08 DIAGNOSIS — M5010 Cervical disc disorder with radiculopathy, unspecified cervical region: Secondary | ICD-10-CM

## 2022-10-08 MED ORDER — IOPAMIDOL (ISOVUE-M 300) INJECTION 61%
1.0000 mL | Freq: Once | INTRAMUSCULAR | Status: AC | PRN
  Administered 2022-10-08: 1 mL via EPIDURAL

## 2022-10-08 MED ORDER — TRIAMCINOLONE ACETONIDE 40 MG/ML IJ SUSP (RADIOLOGY)
60.0000 mg | Freq: Once | INTRAMUSCULAR | Status: AC
  Administered 2022-10-08: 60 mg via EPIDURAL

## 2022-10-08 NOTE — Discharge Instructions (Signed)

## 2023-03-04 ENCOUNTER — Other Ambulatory Visit: Payer: Self-pay | Admitting: Physical Medicine & Rehabilitation

## 2023-03-04 DIAGNOSIS — M5412 Radiculopathy, cervical region: Secondary | ICD-10-CM

## 2023-03-11 ENCOUNTER — Encounter: Payer: Self-pay | Admitting: Physical Medicine & Rehabilitation

## 2023-03-25 NOTE — Discharge Instructions (Signed)

## 2023-03-26 ENCOUNTER — Ambulatory Visit
Admission: RE | Admit: 2023-03-26 | Discharge: 2023-03-26 | Disposition: A | Discharge status: Home / Self Care | Payer: Medicare HMO | Source: Ambulatory Visit | Attending: Physical Medicine & Rehabilitation | Admitting: Physical Medicine & Rehabilitation

## 2023-03-26 DIAGNOSIS — M5412 Radiculopathy, cervical region: Secondary | ICD-10-CM

## 2023-03-26 MED ORDER — IOPAMIDOL (ISOVUE-M 300) INJECTION 61%
1.0000 mL | Freq: Once | INTRAMUSCULAR | Status: AC | PRN
  Administered 2023-03-26: 1 mL via EPIDURAL

## 2023-03-26 MED ORDER — TRIAMCINOLONE ACETONIDE 40 MG/ML IJ SUSP (RADIOLOGY)
60.0000 mg | Freq: Once | INTRAMUSCULAR | Status: AC
  Administered 2023-03-26: 60 mg via EPIDURAL

## 2023-05-13 ENCOUNTER — Ambulatory Visit: Payer: Medicare HMO | Admitting: Licensed Clinical Social Worker

## 2023-06-21 ENCOUNTER — Other Ambulatory Visit: Payer: Self-pay | Admitting: Physical Medicine & Rehabilitation

## 2023-06-21 DIAGNOSIS — M5412 Radiculopathy, cervical region: Secondary | ICD-10-CM

## 2023-06-24 ENCOUNTER — Encounter: Payer: Self-pay | Admitting: Physical Medicine & Rehabilitation

## 2023-07-02 ENCOUNTER — Ambulatory Visit
Admission: RE | Admit: 2023-07-02 | Discharge: 2023-07-02 | Disposition: A | Discharge status: Home / Self Care | Payer: Medicare HMO | Source: Ambulatory Visit | Attending: Physical Medicine & Rehabilitation | Admitting: Physical Medicine & Rehabilitation

## 2023-07-02 DIAGNOSIS — M5412 Radiculopathy, cervical region: Secondary | ICD-10-CM

## 2023-07-02 MED ORDER — TRIAMCINOLONE ACETONIDE 40 MG/ML IJ SUSP (RADIOLOGY)
60.0000 mg | Freq: Once | INTRAMUSCULAR | Status: AC
Start: 2023-07-02 — End: 2023-07-02
  Administered 2023-07-02: 60 mg via EPIDURAL

## 2023-07-02 MED ORDER — IOPAMIDOL (ISOVUE-M 300) INJECTION 61%
1.0000 mL | Freq: Once | INTRAMUSCULAR | Status: AC | PRN
  Administered 2023-07-02: 1 mL via EPIDURAL

## 2023-07-02 NOTE — Discharge Instructions (Signed)

## 2023-08-27 ENCOUNTER — Encounter: Payer: Self-pay | Admitting: Urology

## 2023-08-27 ENCOUNTER — Ambulatory Visit: Payer: Medicare HMO | Admitting: Urology

## 2023-08-27 VITALS — BP 151/77 | HR 77 | Ht 69.0 in | Wt 150.0 lb

## 2023-08-27 DIAGNOSIS — R399 Unspecified symptoms and signs involving the genitourinary system: Secondary | ICD-10-CM | POA: Diagnosis not present

## 2023-08-27 DIAGNOSIS — R972 Elevated prostate specific antigen [PSA]: Secondary | ICD-10-CM

## 2023-08-27 NOTE — Progress Notes (Signed)
 I, Maysun LITTIE Griffiths, acting as a neurosurgeon for Glendia JAYSON Barba, MD., have documented all relevant documentation on the behalf of Glendia JAYSON Barba, MD, as directed by Glendia JAYSON Barba, MD while in the presence of Glendia JAYSON Barba, MD.  08/27/2023 9:35 AM   Jared Castro 1932/11/22 [id number]  Referring provider: Fernande Ophelia JINNY DOUGLAS, MD [address] [facility] [place],  [place] [address]  Chief Complaint  Patient presents with   Elevated PSA   Urologic history: Previously followed for an elevated PSA by Dr. Luke at [facility] and Dr. Claudene at [facility] Initially saw Dr. Claudene September 2022 for an elevated PSA of 20 Prior APR 1980 Summary of urologic history as follows 2007 PSA 1.52 2007 Cystoscopy for gross hematuria (presumably negative) 02/2008 Bladder stone noted with bladder neck obstruction, PSA 1.52 03/29/08 TURP, cystolithalopaxy, Holmium laser 10/19/2016 PSA 5.42, Cr 1.14 11/16/2016 PSA 5.74 04/21/2017 PSA 6.89 05/19/2017 PVR 0mL 06/01/2017 Exosome Dx urine liquid biopsy 33.67% risk of high grade prostate cancer 05/03/2018 Prostate MRI - PIRADS 4 lesion anterior transition zone mid-gland 08/04/2018 Deemed too great a risk to attempt biopsy at [facility] (based on notes), Lack of rectum challenging 11/14/19 Bone scan - increased uptake at left greater trochanter 01/22/21 PSA 20.59 03/19/21 PSA 16 Prostate MRI repeated at [facility] March 2023.  Volume was 30.2 mL and no lesions were identified that were suspicious for high-grade prostate cancer It was Dr. Theressa recommendation that based on current prostate cancer screening guidelines, negative MRI and difficulty/risks of prostate biopsy with previous APR that no biopsy be performed and to discontinue further PSA screening.  The patient was in agreement with this plan  HPI: Jared Castro is a 89 y.o. male presents for annual follow-up.   No complaints since his last visit.  No bothersome lower urinary tract symptoms.  Denies dysuria or  gross hematuria.  Urinalysis performed last month at [facility] was unremarkable.   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   Stasis dermatitis    Tinea pedis    Ulcerative colitis [facility])     Surgical History: Past Surgical History:  Procedure Laterality Date   APPENDECTOMY     biventricular ICD  03/09/2016   BLADDER STONE REMOVAL     CATARACT EXTRACTION Bilateral    Colectomy Total Abdominal     FUNCTIONAL ENDOSCOPIC SINUS SURGERY     ILEOSTOMY     PROSTATE SURGERY     prosthetic lens placement       skin cancer removal  11/04/2018   TONSILLECTOMY     TOTAL HIP ARTHROPLASTY Right 08/01/2021   Procedure: TOTAL HIP ARTHROPLASTY ANTERIOR APPROACH;  Surgeon: Kathlynn Sharper, MD;  Location: [facility] ORS;  Service: Orthopedics;  Laterality: Right;    Home Medications:  Allergies as of 08/27/2023   No Known Allergies      Medication List        Accurate as of August 27, 2023  9:35 AM. If you have  any questions, ask your nurse or doctor.          STOP taking these medications    meloxicam 7.5 MG tablet Commonly known as: MOBIC Stopped by: Makaela Cando C Saraphina Lauderbaugh   metoprolol  succinate 25 MG 24 hr tablet Commonly known as: TOPROL -XL Stopped by: Glendia JAYSON Barba       TAKE these medications    Cholecalciferol  25 MCG (1000 UT) tablet Take 1 tablet by mouth daily.   losartan  25 MG tablet Commonly known as: COZAAR  Take 25 mg by mouth daily.   magnesium  oxide 400 (240 Mg) MG tablet Commonly known as: MAG-OX Take 1 tablet (400 mg  total) by mouth 2 (two) times daily.   PRESERVISION AREDS 2 PO Take 1 capsule by mouth in the morning and at bedtime.        Allergies: No Known Allergies  Family History: Family History  Problem Relation Age of Onset   Hypertension Mother    Cancer Mother     Social History:  reports that he has quit smoking. His smoking use included cigarettes. He has never used smokeless tobacco. He reports current alcohol use of about 1.0 standard drink of alcohol per week. He reports that he does not use drugs.   Physical Exam: BP (!) 151/77   Pulse 77   Ht 5' 9 (1.753 m)   Wt 150 lb (68 kg)   BMI 22.15 kg/m   Constitutional:  Alert and oriented, No acute distress. HEENT: [place] AT Respiratory: Normal respiratory effort, no increased work of breathing. Psychiatric: Normal mood and affect.   Assessment & Plan:    1. Elevated PSA Based on prior APR and age PSA testing has been discontinued as above.   2. Lower urinary tract symptoms Mild LUTS which are not bothersome  Continue annual follow-up  I have reviewed the above documentation for accuracy and completeness, and I agree with the above.   Glendia JAYSON Barba, MD  [facility] [address] [place], [place] [address] [phone]

## 2023-10-15 ENCOUNTER — Other Ambulatory Visit: Payer: Self-pay | Admitting: Physical Medicine & Rehabilitation

## 2023-10-15 DIAGNOSIS — M5412 Radiculopathy, cervical region: Secondary | ICD-10-CM

## 2023-10-21 ENCOUNTER — Other Ambulatory Visit: Payer: Medicare HMO

## 2023-10-25 NOTE — Discharge Instructions (Signed)

## 2023-10-26 ENCOUNTER — Inpatient Hospital Stay
Admission: RE | Admit: 2023-10-26 | Discharge: 2023-10-26 | Disposition: A | Discharge status: Home / Self Care | Payer: Medicare HMO | Source: Ambulatory Visit | Attending: Physical Medicine & Rehabilitation | Admitting: Physical Medicine & Rehabilitation

## 2023-11-08 NOTE — Discharge Instructions (Signed)

## 2023-11-09 ENCOUNTER — Ambulatory Visit
Admission: RE | Admit: 2023-11-09 | Discharge: 2023-11-09 | Disposition: A | Discharge status: Home / Self Care | Source: Ambulatory Visit | Attending: Physical Medicine & Rehabilitation | Admitting: Physical Medicine & Rehabilitation

## 2023-11-09 DIAGNOSIS — M5412 Radiculopathy, cervical region: Secondary | ICD-10-CM

## 2023-11-09 MED ORDER — IOPAMIDOL (ISOVUE-M 300) INJECTION 61%
1.0000 mL | Freq: Once | INTRAMUSCULAR | Status: AC | PRN
  Administered 2023-11-09: 1 mL via EPIDURAL

## 2023-11-09 MED ORDER — TRIAMCINOLONE ACETONIDE 40 MG/ML IJ SUSP (RADIOLOGY)
60.0000 mg | Freq: Once | INTRAMUSCULAR | Status: AC
  Administered 2023-11-09: 60 mg via EPIDURAL

## 2023-11-16 ENCOUNTER — Other Ambulatory Visit (HOSPITAL_COMMUNITY): Payer: Self-pay | Admitting: Neurology

## 2023-11-16 ENCOUNTER — Other Ambulatory Visit: Payer: Self-pay | Admitting: Neurology

## 2023-11-16 DIAGNOSIS — G20A1 Parkinson's disease without dyskinesia, without mention of fluctuations: Secondary | ICD-10-CM

## 2023-11-25 ENCOUNTER — Other Ambulatory Visit: Payer: Self-pay

## 2023-11-25 ENCOUNTER — Encounter
Admission: RE | Disposition: A | Discharge status: Home / Self Care | Payer: Self-pay | Source: Home / Self Care | Attending: Cardiology

## 2023-11-25 ENCOUNTER — Ambulatory Visit
Admission: RE | Admit: 2023-11-25 | Discharge: 2023-11-25 | Disposition: A | Discharge status: Home / Self Care | Attending: Cardiology | Admitting: Cardiology

## 2023-11-25 ENCOUNTER — Encounter: Payer: Self-pay | Admitting: Cardiology

## 2023-11-25 DIAGNOSIS — Z45010 Encounter for checking and testing of cardiac pacemaker pulse generator [battery]: Secondary | ICD-10-CM

## 2023-11-25 DIAGNOSIS — Z4502 Encounter for adjustment and management of automatic implantable cardiac defibrillator: Secondary | ICD-10-CM | POA: Diagnosis present

## 2023-11-25 HISTORY — DX: Rheumatic fever without heart involvement: I00

## 2023-11-25 HISTORY — PX: PPM GENERATOR CHANGEOUT: EP1233

## 2023-11-25 SURGERY — PPM GENERATOR CHANGEOUT
Anesthesia: Moderate Sedation

## 2023-11-25 MED ORDER — CEFAZOLIN (ANCEF) 1 G IV SOLR: INTRAVENOUS | Status: DC | PRN

## 2023-11-25 MED ORDER — LIDOCAINE HCL (PF) 1 % IJ SOLN
INTRAMUSCULAR | Status: DC | PRN
  Administered 2023-11-25: 20 mL

## 2023-11-25 MED ORDER — CEPHALEXIN 750 MG PO CAPS: 750.0000 mg | ORAL_CAPSULE | Freq: Two times a day (BID) | ORAL | 0 refills | Status: AC

## 2023-11-25 MED ORDER — HEPARIN (PORCINE) IN NACL 1000-0.9 UT/500ML-% IV SOLN
INTRAVENOUS | Status: AC
  Filled 2023-11-25: qty 1000

## 2023-11-25 MED ORDER — SODIUM CHLORIDE 0.9 % IV SOLN: INTRAVENOUS | Status: DC

## 2023-11-25 MED ORDER — SODIUM CHLORIDE 0.9 % IV SOLN
80.0000 mg | INTRAVENOUS | Status: AC
  Administered 2023-11-25: 80 mg
  Filled 2023-11-25: qty 2

## 2023-11-25 MED ORDER — SODIUM CHLORIDE 0.9 % IV SOLN: INTRAVENOUS | Status: DC | PRN

## 2023-11-25 MED ORDER — CEFAZOLIN SODIUM-DEXTROSE 2-4 GM/100ML-% IV SOLN
2.0000 g | INTRAVENOUS | Status: AC
  Administered 2023-11-25: 2 g via INTRAVENOUS

## 2023-11-25 MED ORDER — FENTANYL CITRATE (PF) 100 MCG/2ML IJ SOLN
INTRAMUSCULAR | Status: DC | PRN
  Administered 2023-11-25: 12.5 ug via INTRAVENOUS

## 2023-11-25 MED ORDER — CEFAZOLIN SODIUM-DEXTROSE 2-4 GM/100ML-% IV SOLN
INTRAVENOUS | Status: AC
Start: 2023-11-25 — End: ?
  Filled 2023-11-25: qty 100

## 2023-11-25 MED ORDER — MIDAZOLAM HCL 2 MG/2ML IJ SOLN
INTRAMUSCULAR | Status: DC | PRN
  Administered 2023-11-25: .5 mg via INTRAVENOUS

## 2023-11-25 MED ORDER — LIDOCAINE HCL 1 % IJ SOLN
INTRAMUSCULAR | Status: AC
  Filled 2023-11-25: qty 60

## 2023-11-25 MED ORDER — ACETAMINOPHEN 325 MG PO TABS: 325.0000 mg | ORAL_TABLET | ORAL | Status: DC | PRN

## 2023-11-25 MED ORDER — FENTANYL CITRATE (PF) 100 MCG/2ML IJ SOLN
INTRAMUSCULAR | Status: AC
  Filled 2023-11-25: qty 2

## 2023-11-25 MED ORDER — MIDAZOLAM HCL 2 MG/2ML IJ SOLN
INTRAMUSCULAR | Status: AC
  Filled 2023-11-25: qty 2

## 2023-11-25 MED ORDER — ONDANSETRON HCL 4 MG/2ML IJ SOLN: 4.0000 mg | Freq: Four times a day (QID) | INTRAMUSCULAR | Status: DC | PRN

## 2023-11-25 SURGICAL SUPPLY — 21 items
DEVICE DSSCT PLSMBLD 3.0S LGHT (MISCELLANEOUS) IMPLANT
DRAPE BRACHIAL (DRAPES) IMPLANT
DRAPE INCISE 23X17 STRL (DRAPES) IMPLANT
DRAPE INCISE IOBAN 23X17 STRL (DRAPES) ×1 IMPLANT
GLIDESHEATH SLEND SS 6F .021 (SHEATH) IMPLANT
GUIDEWIRE INQWIRE 1.5J.035X260 (WIRE) IMPLANT
ICD COBALT XT QUAD CRT DTPA2QQ (ICD Generator) IMPLANT
INQWIRE 1.5J .035X260CM (WIRE) IMPLANT
KIT ENCORE 26 ADVANTAGE (KITS) IMPLANT
PACK CARDIAC CATH (CUSTOM PROCEDURE TRAY) IMPLANT
PAD ELECT DEFIB RADIOL ZOLL (MISCELLANEOUS) IMPLANT
PLASMABLADE 3.0S W/LIGHT (MISCELLANEOUS) ×1 IMPLANT
POUCH AIGIS-R ANTIBACT ICD (Mesh General) IMPLANT
POUCH AIGIS-R ANTIBACT ICD LRG (Mesh General) IMPLANT
PROTECTION STATION PRESSURIZED (MISCELLANEOUS) IMPLANT
SET ATX-X65L (MISCELLANEOUS) IMPLANT
STATION PROTECTION PRESSURIZED (MISCELLANEOUS) IMPLANT
SUT VIC AB 2-0 CT1 TAPERPNT 27 (SUTURE) IMPLANT
SUT VIC AB 4-0 PS2 18 (SUTURE) IMPLANT
TRAY PACEMAKER INSERTION (PACKS) ×1 IMPLANT
TUBING CIL FLEX 10 FLL-RA (TUBING) IMPLANT

## 2023-11-25 NOTE — Discharge Instructions (Addendum)
 Patient may shower  11/27/2023.  Patient may remove outer bandage after shower.  Leave Steri-Strips on.Cardioverter Defibrillator Implantation, Care After The following information offers guidance on how to care for yourself after your procedure. Your health care provider may also give you more specific instructions. If you have problems or questions, contact your health care provider. What can I expect after the procedure? After the procedure, it is common to have: Some pain. Pain may last a few days. A slight bump over the skin where the device was placed. Sometimes, it is possible to feel the device under the skin. This is normal. During the months and years after your procedure, your health care provider will check the device, the wires (leads), and the battery every few months. The generator is monitored over time and will be replaced when the battery is depleted. Follow these instructions at home: Medicines Take over-the-counter and prescription medicines only as told by your health care provider. If you were prescribed an antibiotic medicine, take it as told by your health care provider. Do not stop taking the antibiotic even if you start to feel better. Bathing Do not take baths, swim, or use a hot tub for 7 days You may shower on 11/30 you may remove your dressing then but leave steri strips until your follow up visit on 12/8 Incision care  Follow instructions from your health care provider about how to take care of your incision. Make sure you: Wash your hands with soap and water for at least 20 seconds before and after you change your bandage (dressing). If soap and water are not available, use hand sanitizer. Remove your dressing in 2 days but leave steri strips intact   These skin closures may need to stay in place for 2 weeks or longer. If adhesive strip edges start to loosen and curl up, you may trim the loose edges. Do not remove adhesive strips completely unless your health care  provider tells you to do that. Check your incision area every day for signs of infection. Check for: Redness, swelling, or more pain. Fluid or blood. Warmth. Pus or a bad smell. Do not use lotions or ointments near the incision area unless told by your health care provider. Do not wear tight clothes or clothes that could rub on your incision. Activity  Do not lift anything that is heavier than 10 lb (4.5 kg), or the limit that you are told, until your health care provider says that it is safe. Return to your normal activities Avoid sitting for a long time without moving.  Get up to take short walks every 1-2 hours. This is important to improve blood flow and breathing. Ask for help if you feel weak or unsteady. Do not drive or operate machinery for 24 hours Participate in a cardiac rehabilitation program as told by your health care provider. A cardiac rehabilitation program is a treatment program to improve your health and well-being through exercise training, education, and counseling. Electric and magnetic fields Tell all health care providers, including your dentist, that you have a defibrillator. Some medical devices and imaging methods such as MRI may affect your device. If you must pass through a metal detector, quickly walk through it. Do not stop under the detector. Do not stand near it. Avoid places or objects that have a strong electric or magnetic field, including: Scientist, physiological. At the airport, let officials know that you have a defibrillator. Your defibrillator ID card will let you be checked in  a way that is safe for you and will not damage your defibrillator. Do not let a security person wave a magnetic wand near your defibrillator. That can make it stop working. Power plants. Large electrical generators. Anti-theft systems or electronic article surveillance (EAS). Radiofrequency transmission towers, such as mobile phone and radio towers. Some household devices and  appliances may produce electromagnetic waves that affect your defibrillator. If you are not sure if something is safe to use, ask your health care provider. When using your mobile phone, hold it to the ear that is on the opposite side from the defibrillator. Do not leave your mobile phone in a pocket over the defibrillator. Many devices contain magnets. Examples include headphones and electronic cigarettes. Do not put them in a pocket near your device. If they are wearable, wear them as far away from the device as possible. Some devices are safe to use if they are held at least 12 inches (30 cm) away from your defibrillator. These include power tools, chain saws, lawn mowers, and speakers. You may safely use electric blankets, heating pads, computers, and microwave ovens. Do not use amateur or ham radio equipment, electric (arc) welding torches, magnetic mattresses or chairs, and body fat measuring scales. General instructions Always keep your defibrillator ID card with you. The card should list the implant date, device model, and manufacturer. Consider wearing a medical alert bracelet or necklace. Have your defibrillator checked as often as told by your health care provider. Most defibrillators last for 5-7 years. Do not use any products that contain nicotine or tobacco. These products include cigarettes, chewing tobacco, and vaping devices, such as e-cigarettes. If you need help quitting, ask your health care provider. Discuss specific device restrictions with your device manufacturer or health care provider. Get screened for depression and anxiety, and seek treatment if needed. Keep all follow-up visits. This is important. Contact a health care provider if: You feel one shock in your chest. You gain weight suddenly. You have a fever. You have swelling in your arm on the same side of your body as the device or have swelling in your legs or feet. It feels like your heart is fluttering or skipping  beats (heart palpitations). You have any of these signs of infection around your incision: Redness, swelling, or more pain. Fluid or blood. Warmth. Pus or a bad smell. You are feeling depressed, scared, anxious, or sad. Get help right away if: You have chest pain. You feel more than one shock. You have problems breathing or have shortness of breath. You have dizziness or fainting. Your incision starts to open up. These symptoms may represent a serious problem that is an emergency. Do not wait to see if the symptoms will go away. Get medical help right away. Call your local emergency services (911 in the [place] Do not drive yourself to the hospital. Summary Always keep your defibrillator ID card with you. The card should list the implant date, device model, and manufacturer. Consider wearing a medical alert bracelet or necklace. Follow the device distance restrictions for electric and magnetic fields. Keep all follow-up visits with your health care provider for monitoring of your defibrillator. Contact your health care provider if you have signs of an infection such as a fever or redness, swelling, or more pain around your incision. Get help right away if you have chest pain, receive more than one shock, or faint. This information is not intended to replace advice given to you by your health care provider.  Make sure you discuss any questions you have with your health care provider. Document Revised: 01/31/2020 Document Reviewed: 01/31/2020 Elsevier Patient Education  2023 [employer].

## 2023-12-01 ENCOUNTER — Encounter: Payer: Self-pay | Admitting: Cardiology

## 2023-12-03 ENCOUNTER — Other Ambulatory Visit (HOSPITAL_COMMUNITY): Payer: Self-pay | Admitting: Pulmonary Disease

## 2023-12-03 DIAGNOSIS — Z950 Presence of cardiac pacemaker: Secondary | ICD-10-CM

## 2023-12-07 ENCOUNTER — Ambulatory Visit (HOSPITAL_COMMUNITY)
Admission: RE | Admit: 2023-12-07 | Discharge: 2023-12-07 | Disposition: A | Discharge status: Home / Self Care | Source: Ambulatory Visit | Attending: Pulmonary Disease | Admitting: Pulmonary Disease

## 2023-12-07 ENCOUNTER — Ambulatory Visit (HOSPITAL_COMMUNITY)
Admission: RE | Admit: 2023-12-07 | Discharge: 2023-12-07 | Disposition: A | Discharge status: Home / Self Care | Source: Ambulatory Visit | Attending: Neurology | Admitting: Neurology

## 2023-12-07 DIAGNOSIS — G20A1 Parkinson's disease without dyskinesia, without mention of fluctuations: Secondary | ICD-10-CM | POA: Insufficient documentation

## 2023-12-07 DIAGNOSIS — Z950 Presence of cardiac pacemaker: Secondary | ICD-10-CM

## 2023-12-08 NOTE — CV Procedure (Signed)
  Device system confirmed to be MRI conditional, with implant date > 6 weeks ago, and no evidence of abandoned or epicardial leads in review of most recent CXR  Device last cleared by EP Provider: Creighton Doffing  Clearance is good through for 1 year as long as parameters remain stable at time of check. If pt undergoes a cardiac device procedure during that time, they should be re-cleared.   Tachy-therapies to be programmed off if applicable with device back to pre-MRI settings after completion of exam.  [employer] - Programming recommendation received through [employer] App/Tablet  Arlys Lamer, RT  12/08/2023 11:02 AM

## 2024-02-17 ENCOUNTER — Other Ambulatory Visit: Payer: Self-pay | Admitting: Physical Medicine & Rehabilitation

## 2024-02-17 DIAGNOSIS — M5412 Radiculopathy, cervical region: Secondary | ICD-10-CM

## 2024-02-25 IMAGING — MR MR CERVICAL SPINE W/O CM
4 of 5 series · 18 of 48 positions shown · non-contrast
Comparison: None.

CLINICAL DATA: Cervicalgia

EXAM:
MRI CERVICAL SPINE WITHOUT CONTRAST
TECHNIQUE: Multiplanar, multisequence MR imaging of the cervical spine was
performed. No intravenous contrast was administered.

[Series 2: T2 · sagittal · 3.0mm · 0.43mm/px · 6 of 13 slices shown (1 of 2)]
[im 1/13]
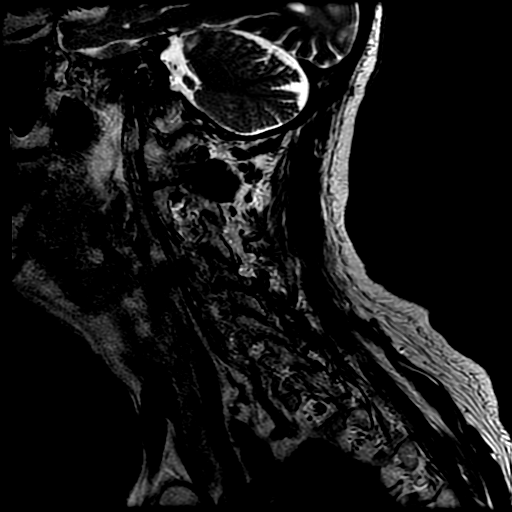
[im 3/13]
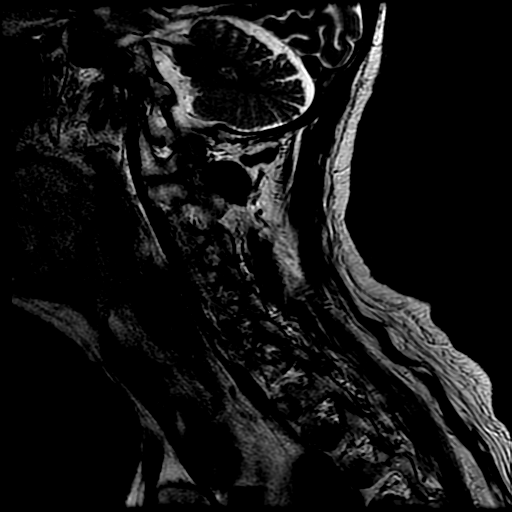
[im 5/13]
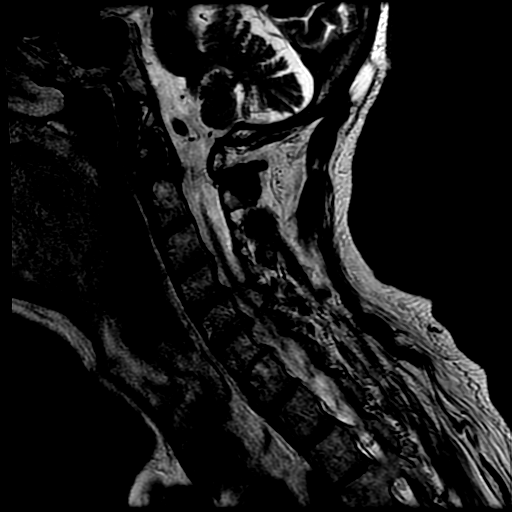
[im 8/13]
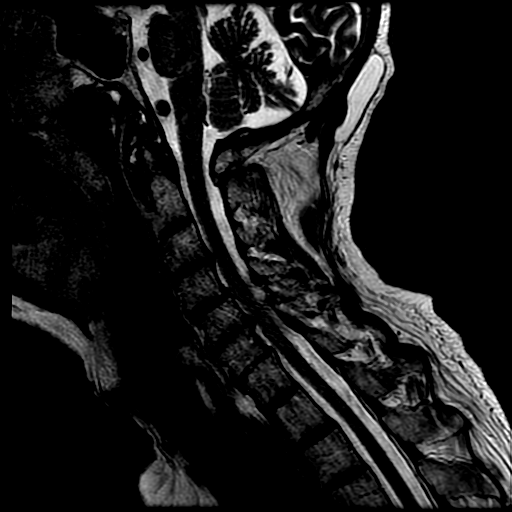
[im 10/13]
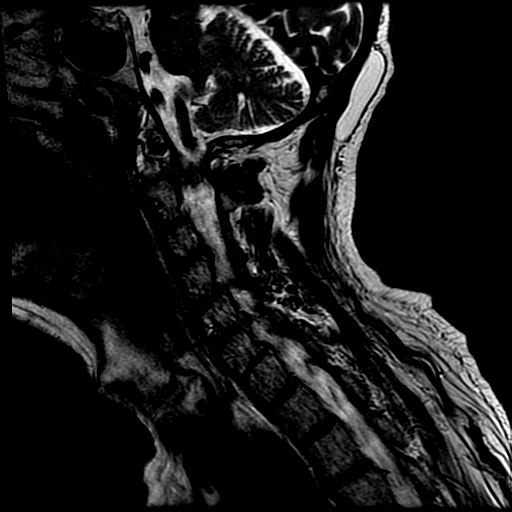
[im 13/13]
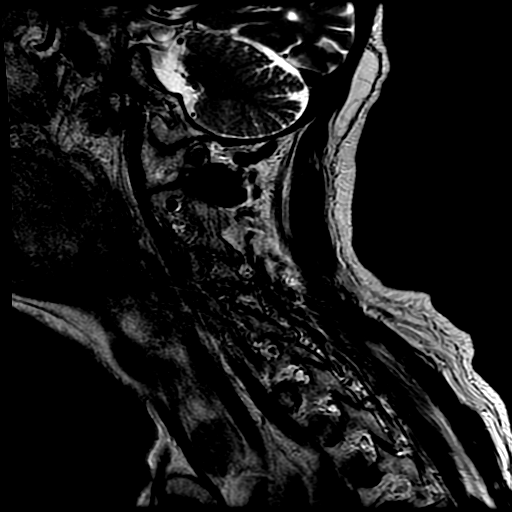

[Series 3: T1 · sagittal · 3.0mm · 0.43mm/px · 3 of 13 slices shown]
[im 1/13]
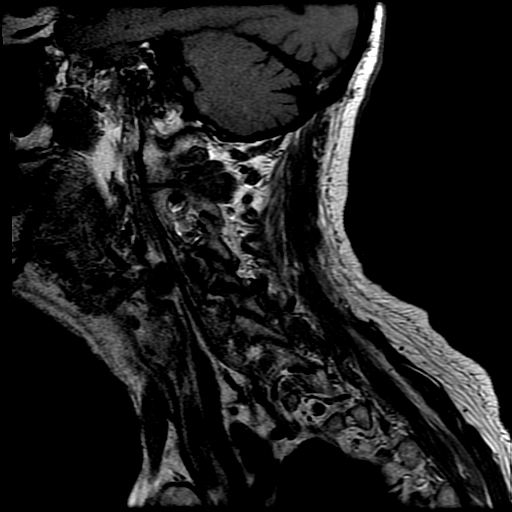
[im 7/13]
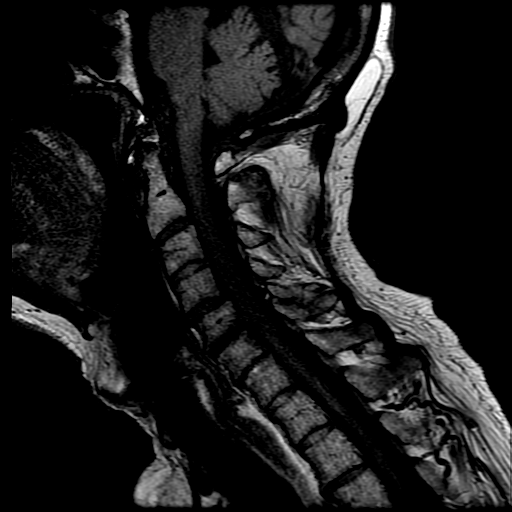
[im 13/13]
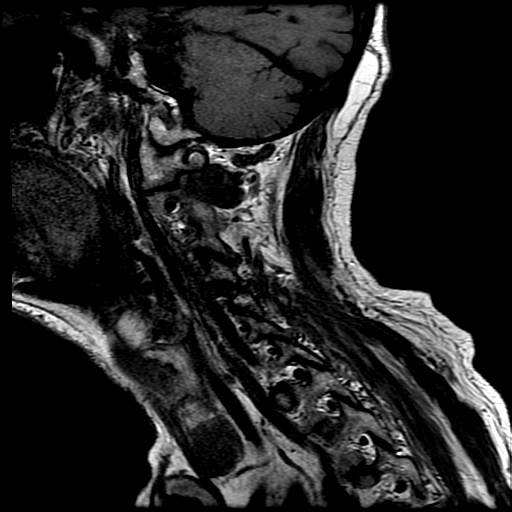

[Series 4: sag ir · sagittal · 3.0mm · 0.43mm/px · 3 of 13 slices shown]
[im 1/13]
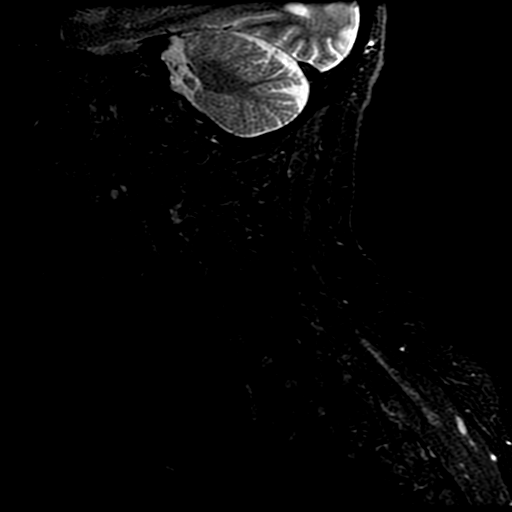
[im 7/13]
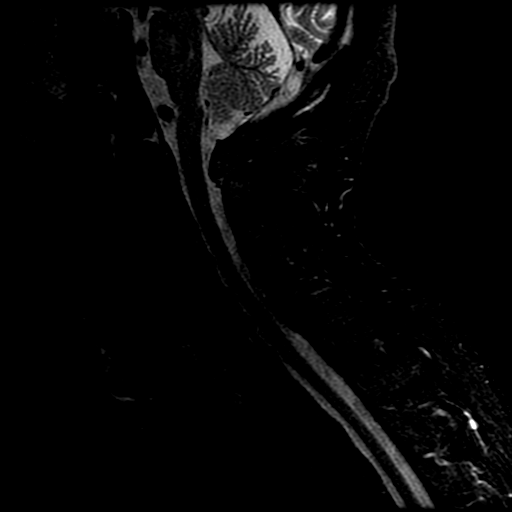
[im 13/13]
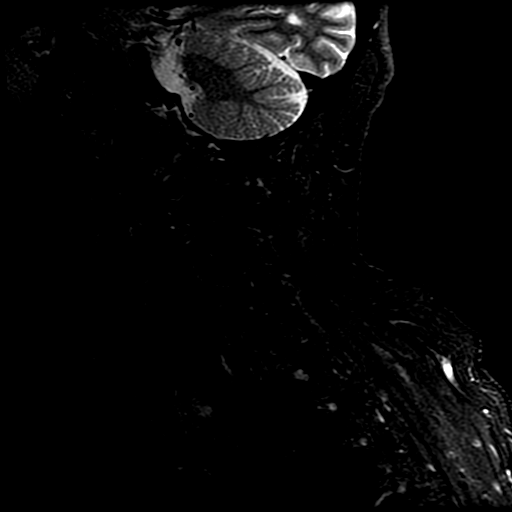

[Series 5: T2 · axial · 3.0mm · 0.39mm/px · z∈[-70,+29]mm · 6 of 41 slices shown (2 of 2)]
[im 3/41]
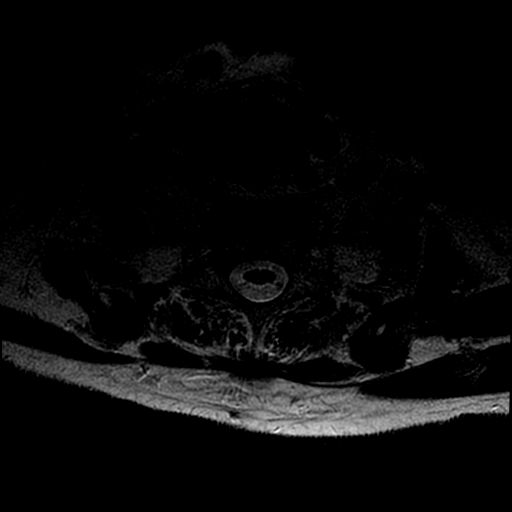
[im 6/41]
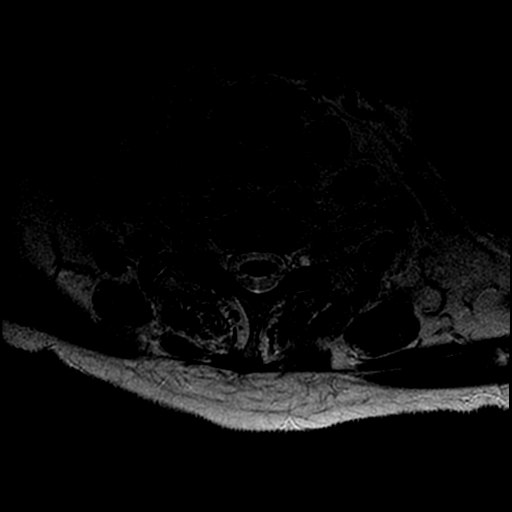
[im 9/41]
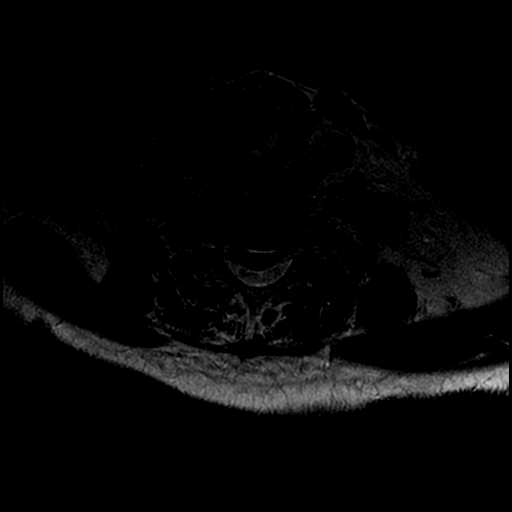
[im 14/41]
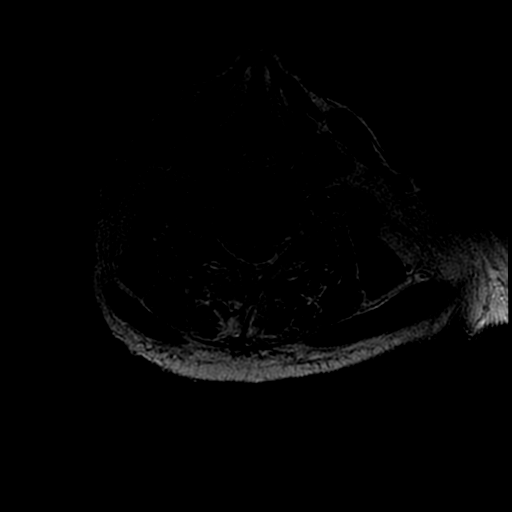
[im 22/41]
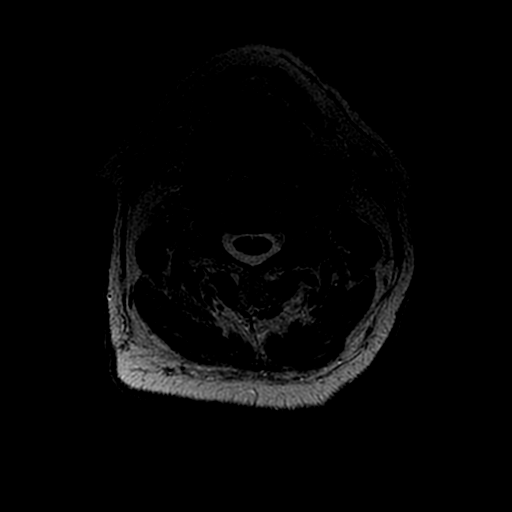
[im 35/41]
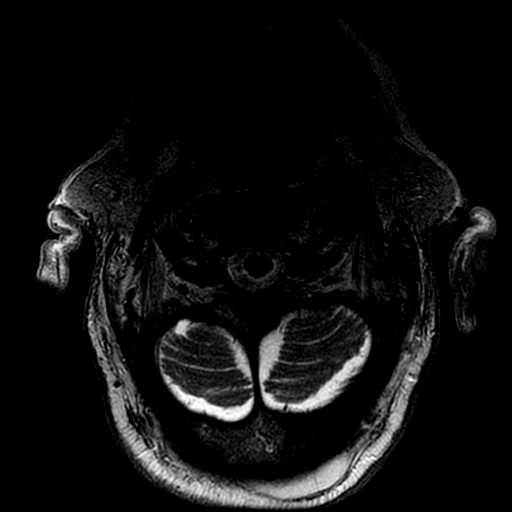

[18 of 48 positions shown; findings below may reference images not displayed]

FINDINGS: Alignment: 2 mm anterolisthesis of C4 on C5 secondary to facet
disease.

Vertebrae: No acute fracture, evidence of discitis, or bone lesion.

Cord: Normal signal and morphology.

Posterior Fossa, vertebral arteries, paraspinal tissues: Posterior
fossa demonstrates no focal abnormality. Vertebral artery flow voids
are maintained. Paraspinal soft tissues are unremarkable.

Disc levels:

Discs: Degenerative disease with disc height loss at C5-6 and C6-7.

C2-3: No significant disc bulge. No neural foraminal stenosis. No
central canal stenosis.

C3-4: Mild broad-based disc bulge. Mild bilateral facet arthropathy.
Moderate bilateral foraminal stenosis. No spinal stenosis.

C4-5: Broad-based disc bulge. Moderate bilateral facet arthropathy.
Bilateral uncovertebral degenerative changes. Moderate bilateral
foraminal stenosis. Mild spinal stenosis.

C5-6: Broad-based disc osteophyte complex. Bilateral uncovertebral
degenerative changes. Moderate right and mild left foraminal
stenosis. Mild spinal stenosis.

C6-7: Mild broad-based disc bulge. Bilateral mild facet arthropathy.
No foraminal or central canal stenosis.

C7-T1: No significant disc bulge. No neural foraminal stenosis. No
central canal stenosis.
IMPRESSION: 1. At C3-4 there is a mild broad-based disc bulge. Mild bilateral
facet arthropathy. Moderate bilateral foraminal stenosis.
2. At C4-5 there is a broad-based disc bulge. Moderate bilateral
facet arthropathy. Bilateral uncovertebral degenerative changes.
Moderate bilateral foraminal stenosis. Mild spinal stenosis.
3. At C5-6 there is a broad-based disc osteophyte complex. Bilateral
uncovertebral degenerative changes. Moderate right and mild left
foraminal stenosis.

## 2024-03-01 NOTE — Discharge Instructions (Signed)

## 2024-03-02 ENCOUNTER — Ambulatory Visit
Admission: RE | Admit: 2024-03-02 | Discharge: 2024-03-02 | Disposition: A | Discharge status: Home / Self Care | Source: Ambulatory Visit | Attending: Physical Medicine & Rehabilitation | Admitting: Physical Medicine & Rehabilitation

## 2024-03-02 DIAGNOSIS — M5412 Radiculopathy, cervical region: Secondary | ICD-10-CM

## 2024-03-02 MED ORDER — TRIAMCINOLONE ACETONIDE 40 MG/ML IJ SUSP (RADIOLOGY)
60.0000 mg | Freq: Once | INTRAMUSCULAR | Status: AC
  Administered 2024-03-02: 60 mg via EPIDURAL

## 2024-03-02 MED ORDER — IOPAMIDOL (ISOVUE-M 300) INJECTION 61%
1.0000 mL | Freq: Once | INTRAMUSCULAR | Status: AC | PRN
  Administered 2024-03-02: 1 mL via EPIDURAL

## 2024-06-22 ENCOUNTER — Other Ambulatory Visit: Payer: Self-pay | Admitting: Physical Medicine & Rehabilitation

## 2024-06-22 DIAGNOSIS — M5412 Radiculopathy, cervical region: Secondary | ICD-10-CM

## 2024-07-05 ENCOUNTER — Encounter: Payer: Self-pay | Admitting: Physical Medicine & Rehabilitation

## 2024-07-06 NOTE — Discharge Instructions (Signed)

## 2024-07-07 ENCOUNTER — Ambulatory Visit
Admission: RE | Admit: 2024-07-07 | Discharge: 2024-07-07 | Disposition: A | Discharge status: Home / Self Care | Source: Ambulatory Visit | Attending: Physical Medicine & Rehabilitation | Admitting: Physical Medicine & Rehabilitation

## 2024-07-07 DIAGNOSIS — M5412 Radiculopathy, cervical region: Secondary | ICD-10-CM

## 2024-07-07 MED ORDER — TRIAMCINOLONE ACETONIDE 40 MG/ML IJ SUSP (RADIOLOGY)
60.0000 mg | Freq: Once | INTRAMUSCULAR | Status: AC
  Administered 2024-07-07: 60 mg via EPIDURAL

## 2024-07-07 MED ORDER — IOPAMIDOL (ISOVUE-M 300) INJECTION 61%
1.0000 mL | Freq: Once | INTRAMUSCULAR | Status: AC | PRN
  Administered 2024-07-07: 1 mL via EPIDURAL

## 2024-08-25 ENCOUNTER — Ambulatory Visit: Payer: Self-pay | Admitting: Urology

## 2024-09-13 ENCOUNTER — Ambulatory Visit: Admitting: Urology

## 2024-10-10 ENCOUNTER — Ambulatory Visit: Admitting: Urology
